# Patient Record
Sex: Male | Born: 1935 | Race: White | Hispanic: No | Marital: Married | Smoking: Never smoker
Health system: Southern US, Community
[De-identification: ages and names within clinical notes are randomized; demographics above are authoritative.]

## PROBLEM LIST (undated history)

## (undated) DIAGNOSIS — E785 Hyperlipidemia, unspecified: Secondary | ICD-10-CM

## (undated) DIAGNOSIS — I509 Heart failure, unspecified: Secondary | ICD-10-CM

## (undated) DIAGNOSIS — F0390 Unspecified dementia without behavioral disturbance: Secondary | ICD-10-CM

## (undated) DIAGNOSIS — I2510 Atherosclerotic heart disease of native coronary artery without angina pectoris: Secondary | ICD-10-CM

## (undated) DIAGNOSIS — I10 Essential (primary) hypertension: Secondary | ICD-10-CM

## (undated) DIAGNOSIS — F03C0 Unspecified dementia, severe, without behavioral disturbance, psychotic disturbance, mood disturbance, and anxiety: Secondary | ICD-10-CM

## (undated) HISTORY — PX: APPENDECTOMY: SHX54

## (undated) HISTORY — PX: CORONARY ARTERY BYPASS GRAFT: SHX141

## (undated) HISTORY — PX: TONSILLECTOMY: SUR1361

---

## 1997-12-25 ENCOUNTER — Inpatient Hospital Stay (HOSPITAL_COMMUNITY): Admission: AD | Admit: 1997-12-25 | Discharge: 1997-12-31 | Payer: Self-pay | Admitting: Cardiology

## 1998-01-15 ENCOUNTER — Encounter (HOSPITAL_COMMUNITY): Admission: RE | Admit: 1998-01-15 | Discharge: 1998-04-15 | Payer: Self-pay | Admitting: Cardiovascular Disease

## 1999-04-16 ENCOUNTER — Ambulatory Visit (HOSPITAL_COMMUNITY): Admission: RE | Admit: 1999-04-16 | Discharge: 1999-04-16 | Payer: Self-pay | Admitting: Specialist

## 1999-05-07 ENCOUNTER — Ambulatory Visit (HOSPITAL_COMMUNITY): Admission: RE | Admit: 1999-05-07 | Discharge: 1999-05-07 | Payer: Self-pay | Admitting: Specialist

## 2005-04-10 ENCOUNTER — Encounter: Admission: RE | Admit: 2005-04-10 | Discharge: 2005-04-10 | Payer: Self-pay | Admitting: Internal Medicine

## 2010-10-05 ENCOUNTER — Observation Stay (HOSPITAL_COMMUNITY)
Admission: EM | Admit: 2010-10-05 | Discharge: 2010-10-06 | Disposition: A | Discharge status: Home / Self Care | Payer: No Typology Code available for payment source | Attending: General Surgery | Admitting: General Surgery

## 2010-10-05 ENCOUNTER — Emergency Department (HOSPITAL_COMMUNITY): Payer: No Typology Code available for payment source

## 2010-10-05 DIAGNOSIS — Z79899 Other long term (current) drug therapy: Secondary | ICD-10-CM | POA: Insufficient documentation

## 2010-10-05 DIAGNOSIS — IMO0002 Reserved for concepts with insufficient information to code with codable children: Secondary | ICD-10-CM | POA: Insufficient documentation

## 2010-10-05 DIAGNOSIS — M47812 Spondylosis without myelopathy or radiculopathy, cervical region: Secondary | ICD-10-CM | POA: Insufficient documentation

## 2010-10-05 DIAGNOSIS — Y92009 Unspecified place in unspecified non-institutional (private) residence as the place of occurrence of the external cause: Secondary | ICD-10-CM | POA: Insufficient documentation

## 2010-10-05 DIAGNOSIS — S20219A Contusion of unspecified front wall of thorax, initial encounter: Principal | ICD-10-CM | POA: Insufficient documentation

## 2010-10-05 DIAGNOSIS — R9389 Abnormal findings on diagnostic imaging of other specified body structures: Secondary | ICD-10-CM | POA: Insufficient documentation

## 2010-10-05 DIAGNOSIS — I10 Essential (primary) hypertension: Secondary | ICD-10-CM | POA: Insufficient documentation

## 2010-10-05 DIAGNOSIS — S1093XA Contusion of unspecified part of neck, initial encounter: Secondary | ICD-10-CM | POA: Insufficient documentation

## 2010-10-05 DIAGNOSIS — I2510 Atherosclerotic heart disease of native coronary artery without angina pectoris: Secondary | ICD-10-CM | POA: Insufficient documentation

## 2010-10-05 DIAGNOSIS — S0003XA Contusion of scalp, initial encounter: Secondary | ICD-10-CM | POA: Insufficient documentation

## 2010-10-05 DIAGNOSIS — R071 Chest pain on breathing: Secondary | ICD-10-CM | POA: Insufficient documentation

## 2010-10-05 DIAGNOSIS — E785 Hyperlipidemia, unspecified: Secondary | ICD-10-CM | POA: Insufficient documentation

## 2010-10-05 DIAGNOSIS — S6000XA Contusion of unspecified finger without damage to nail, initial encounter: Secondary | ICD-10-CM | POA: Insufficient documentation

## 2010-10-05 DIAGNOSIS — Y998 Other external cause status: Secondary | ICD-10-CM | POA: Insufficient documentation

## 2010-10-05 LAB — BASIC METABOLIC PANEL
BUN: 12 mg/dL (ref 6–23)
CO2: 19 mEq/L (ref 19–32)
Chloride: 99 mEq/L (ref 96–112)
GFR calc Af Amer: >60 mL/min (ref >60)
GFR calc non Af Amer: >60 mL/min (ref >60)
Glucose, Bld: 109 mg/dL — ABNORMAL HIGH (ref 70–99)
Potassium: 4.2 mEq/L (ref 3.5–5.1)
Sodium: 133 mEq/L — ABNORMAL LOW (ref 135–145)

## 2010-10-05 LAB — POCT CARDIAC MARKERS
CKMB, poc: 4.4 ng/mL (ref 1.0–8.0)
Myoglobin, poc: 367 ng/mL (ref 12–200)
Troponin i, poc: <0.05 ng/mL (ref 0.00–0.09)

## 2010-10-05 LAB — PROTIME-INR
INR: 0.99 (ref 0.00–1.49)
Prothrombin Time: 13.3 seconds (ref 11.6–15.2)

## 2010-10-05 LAB — POCT I-STAT, CHEM 8
BUN: 11 mg/dL (ref 6–23)
Chloride: 102 mEq/L (ref 96–112)
Creatinine, Ser: 0.9 mg/dL (ref 0.4–1.5)
Glucose, Bld: 103 mg/dL — ABNORMAL HIGH (ref 70–99)
Hemoglobin: 16 g/dL (ref 13.0–17.0)
Potassium: 4 mEq/L (ref 3.5–5.1)
Sodium: 138 mEq/L (ref 135–145)
TCO2: 26 mmol/L (ref 0–100)

## 2010-10-05 LAB — CBC
HCT: 42.6 % (ref 39.0–52.0)
Hemoglobin: 15.3 g/dL (ref 13.0–17.0)
MCH: 32.4 pg (ref 26.0–34.0)
MCHC: 35.9 g/dL (ref 30.0–36.0)
RBC: 4.72 MIL/uL (ref 4.22–5.81)
RDW: 14.2 % (ref 11.5–15.5)
WBC: 13.2 10*3/uL — ABNORMAL HIGH (ref 4.0–10.5)

## 2010-10-05 LAB — DIFFERENTIAL
Basophils Absolute: 0 10*3/uL (ref 0.0–0.1)
Basophils Relative: 0 % (ref 0–1)
Eosinophils Relative: 0 % (ref 0–5)
Lymphs Abs: 0.6 10*3/uL — ABNORMAL LOW (ref 0.7–4.0)
Monocytes Absolute: 1.2 10*3/uL — ABNORMAL HIGH (ref 0.1–1.0)
Monocytes Relative: 9 % (ref 3–12)
Neutro Abs: 11.3 10*3/uL — ABNORMAL HIGH (ref 1.7–7.7)
Neutrophils Relative %: 86 % — ABNORMAL HIGH (ref 43–77)

## 2010-10-05 LAB — ABO/RH: ABO/RH(D): O POS

## 2010-10-05 LAB — TYPE AND SCREEN
ABO/RH(D): O POS
Antibody Screen: NEGATIVE

## 2010-10-05 MED ORDER — IOHEXOL 300 MG/ML  SOLN
100.0000 mL | Freq: Once | INTRAMUSCULAR | Status: AC | PRN
  Administered 2010-10-05: 100 mL via INTRAVENOUS

## 2010-10-06 LAB — BASIC METABOLIC PANEL
Calcium: 9 mg/dL (ref 8.4–10.5)
Chloride: 101 mEq/L (ref 96–112)
Creatinine, Ser: 0.7 mg/dL (ref 0.4–1.5)
GFR calc Af Amer: >60 mL/min (ref >60)
GFR calc non Af Amer: >60 mL/min (ref >60)
Glucose, Bld: 114 mg/dL — ABNORMAL HIGH (ref 70–99)
Potassium: 3.7 mEq/L (ref 3.5–5.1)
Sodium: 136 mEq/L (ref 135–145)

## 2010-10-06 LAB — CBC
HCT: 40.8 % (ref 39.0–52.0)
Hemoglobin: 14.3 g/dL (ref 13.0–17.0)
MCV: 91.1 fL (ref 78.0–100.0)
Platelets: 200 10*3/uL (ref 150–400)
RBC: 4.48 MIL/uL (ref 4.22–5.81)
RDW: 14.4 % (ref 11.5–15.5)
WBC: 9 10*3/uL (ref 4.0–10.5)
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
## 2013-04-15 ENCOUNTER — Emergency Department (HOSPITAL_COMMUNITY): Payer: Medicare Other

## 2013-04-15 ENCOUNTER — Encounter (HOSPITAL_COMMUNITY): Payer: Self-pay | Admitting: Emergency Medicine

## 2013-04-15 ENCOUNTER — Emergency Department (HOSPITAL_COMMUNITY)
Admission: EM | Admit: 2013-04-15 | Discharge: 2013-04-15 | Disposition: A | Discharge status: Home / Self Care | Payer: Medicare Other | Attending: Emergency Medicine | Admitting: Emergency Medicine

## 2013-04-15 DIAGNOSIS — W010XXA Fall on same level from slipping, tripping and stumbling without subsequent striking against object, initial encounter: Secondary | ICD-10-CM | POA: Insufficient documentation

## 2013-04-15 DIAGNOSIS — S022XXA Fracture of nasal bones, initial encounter for closed fracture: Secondary | ICD-10-CM

## 2013-04-15 DIAGNOSIS — S59909A Unspecified injury of unspecified elbow, initial encounter: Secondary | ICD-10-CM | POA: Insufficient documentation

## 2013-04-15 DIAGNOSIS — S6990XA Unspecified injury of unspecified wrist, hand and finger(s), initial encounter: Secondary | ICD-10-CM | POA: Insufficient documentation

## 2013-04-15 DIAGNOSIS — Y9289 Other specified places as the place of occurrence of the external cause: Secondary | ICD-10-CM | POA: Insufficient documentation

## 2013-04-15 DIAGNOSIS — Y9301 Activity, walking, marching and hiking: Secondary | ICD-10-CM | POA: Insufficient documentation

## 2013-04-15 DIAGNOSIS — S0990XA Unspecified injury of head, initial encounter: Secondary | ICD-10-CM | POA: Insufficient documentation

## 2013-04-15 DIAGNOSIS — I10 Essential (primary) hypertension: Secondary | ICD-10-CM | POA: Insufficient documentation

## 2013-04-15 DIAGNOSIS — Z9089 Acquired absence of other organs: Secondary | ICD-10-CM | POA: Insufficient documentation

## 2013-04-15 NOTE — ED Notes (Signed)
Bed: JW11 Expected date: 04/15/13 Expected time: 4:05 PM Means of arrival: Ambulance Comments: fall

## 2013-04-15 NOTE — ED Notes (Signed)
Per EMS patient was shopping and tripped. No LOC. Hit right side of face and skinned up hands trying to break his fall. Denies pain to neck and back.

## 2013-04-15 NOTE — ED Notes (Signed)
Goose egg to right forehead above eye. Deep skin tear to right base of thumb.Scratches to left hand. Skin tear across bridge of nose. All wounds cleansed with soap and water. Bacitracin applied where appropriate.

## 2013-04-15 NOTE — ED Provider Notes (Signed)
CSN: [id number]     Arrival date & time 04/15/13  1604 History   First MD Initiated Contact with Patient 04/15/13 1614     Chief Complaint  Patient presents with  . Fall   (Consider location/radiation/quality/duration/timing/severity/associated sxs/prior Treatment) HPI Patient presents after a mechanical fall with pain in his face, right wrist. Patient recalls the entirety of the event.  He states that he was walking across a parking lot, slipped fell to the ground.  No loss of consciousness, no subsequent confusion, disorientation, weakness in any extremity. Since the event there has been persistent.  No visual changes. Patient has minimal neck pain, but some pain with lateral rotation.  patient states that he generally well, was in his usual state of health prior to the onset of pain. History reviewed. No pertinent past medical history. Past Surgical History  Procedure Laterality Date  . Tonsillectomy    . Appendectomy     No family history on file. History  Substance Use Topics  . Smoking status: Never Smoker   . Smokeless tobacco: Not on file  . Alcohol Use: No     Comment: occassional    Review of Systems  All other systems reviewed and are negative.    Allergies  Review of patient's allergies indicates no known allergies.  Home Medications  No current outpatient prescriptions on file. BP 181/103  Pulse 77  Temp(Src) 97.8 F (36.6 C) (Oral)  Resp 20  SpO2 97% Physical Exam  Nursing note and vitals reviewed. Constitutional: He is oriented to person, place, and time. He appears well-developed. No distress.  HENT:  Head: Normocephalic and atraumatic.    Eyes: Conjunctivae and EOM are normal. Pupils are equal, round, and reactive to light.  Neck:  No tenderness to palpation about the midline, nor any deformity.  She describes mild tenderness in the midline with right lateral rotation.   Cardiovascular: Normal rate and regular rhythm.   Pulmonary/Chest:  Effort normal. No stridor. No respiratory distress.  Abdominal: He exhibits no distension. There is no tenderness.  Musculoskeletal: He exhibits no edema.       Arms: Neurological: He is alert and oriented to person, place, and time.  Skin: Skin is warm and dry.  Psychiatric: He has a normal mood and affect.    ED Course  Procedures (including critical care time) Labs Review Labs Reviewed - No data to display Imaging Review Dg Wrist Complete Right  04/15/2013   CLINICAL DATA:  Laceration of the base of the thumb.  EXAM: RIGHT WRIST - COMPLETE 3+ VIEW  COMPARISON:  None.  FINDINGS: There is severe osteopenia. There is no fracture or dislocation. There are severe degenerative changes of the triscaphe joint and 1st CMC joint. There are degenerative changes of the 1st, 2nd and 3rd MCP joints. There is no radiopaque foreign body.  IMPRESSION: No acute osseous injury of the right wrist. No radiopaque foreign body.   Electronically Signed   By: Elige Ko   On: 04/15/2013 17:02   Ct Head Wo Contrast  04/15/2013   CLINICAL DATA:  The patient tripped while shopping. Hit the right side of the face. No loss of consciousness.  EXAM: CT HEAD WITHOUT CONTRAST  CT MAXILLOFACIAL WITHOUT CONTRAST  CT CERVICAL SPINE WITHOUT CONTRAST  TECHNIQUE: Multidetector CT imaging of the head, cervical spine, and maxillofacial structures were performed using the standard protocol without intravenous contrast. Multiplanar CT image reconstructions of the cervical spine and maxillofacial structures were also generated.  COMPARISON:  10/05/2010  FINDINGS: CT HEAD FINDINGS  Large right frontal scalp hematoma is identified without underlying calvarial fracture.  There is moderate central and cortical atrophy. Periventricular white matter changes are consistent with small vessel disease. There is no evidence for hemorrhage, mass lesion, or acute infarction. Bilateral nasal bone fractures are identified. Orbits are intact.  CT  MAXILLOFACIAL FINDINGS  Large right frontal scalp hematoma is identified. There is no underlying calvarial fracture there are bilateral nasal bone fractures with minimal displacement. The bony nasal septum is intact. The orbits are intact. The globes are intact. Zygomatic arches, maxilla, mandible, temporomandibular joints, View pterygoid plates are intact. There is minimal mucoperiosteal thickening of the ethmoid sinuses.  CT CERVICAL SPINE FINDINGS  There is exaggerated cervical lordosis. Degenerative changes are seen throughout the cervical spine, most notably at C4-5, C5-6. Additionally, there is degenerative change at the T1-2 level, incidentally imaged. There is 2 mm of retrolisthesis of C5 on C6. There is 2 mm of anterolisthesis of C7 on T1. These changes are felt to be degenerative. There is no evidence for acute fracture or subluxation.  The lung apices are clear. Note is made of a left thyroid nodule, measuring 9 mm. As needed, further evaluation can be performed with ultrasound.  IMPRESSION: 1. Large right frontal scalp hematoma without underlying calvarial fracture. 2. Atrophy and small vessel disease without evidence for acute intracranial abnormality. 3. Bilateral nasal bone fractures. 4. Significant cervical degenerative change without evidence for acute cervical spine abnormality. 5. Incidentally noted left thyroid nodule. If needed, further evaluation with ultrasound can be performed.   Electronically Signed   By: Rosalie Gums M.D.   On: 04/15/2013 17:11   Ct Cervical Spine Wo Contrast  04/15/2013   CLINICAL DATA:  The patient tripped while shopping. Hit the right side of the face. No loss of consciousness.  EXAM: CT HEAD WITHOUT CONTRAST  CT MAXILLOFACIAL WITHOUT CONTRAST  CT CERVICAL SPINE WITHOUT CONTRAST  TECHNIQUE: Multidetector CT imaging of the head, cervical spine, and maxillofacial structures were performed using the standard protocol without intravenous contrast. Multiplanar CT image  reconstructions of the cervical spine and maxillofacial structures were also generated.  COMPARISON:  10/05/2010  FINDINGS: CT HEAD FINDINGS  Large right frontal scalp hematoma is identified without underlying calvarial fracture.  There is moderate central and cortical atrophy. Periventricular white matter changes are consistent with small vessel disease. There is no evidence for hemorrhage, mass lesion, or acute infarction. Bilateral nasal bone fractures are identified. Orbits are intact.  CT MAXILLOFACIAL FINDINGS  Large right frontal scalp hematoma is identified. There is no underlying calvarial fracture there are bilateral nasal bone fractures with minimal displacement. The bony nasal septum is intact. The orbits are intact. The globes are intact. Zygomatic arches, maxilla, mandible, temporomandibular joints, View pterygoid plates are intact. There is minimal mucoperiosteal thickening of the ethmoid sinuses.  CT CERVICAL SPINE FINDINGS  There is exaggerated cervical lordosis. Degenerative changes are seen throughout the cervical spine, most notably at C4-5, C5-6. Additionally, there is degenerative change at the T1-2 level, incidentally imaged. There is 2 mm of retrolisthesis of C5 on C6. There is 2 mm of anterolisthesis of C7 on T1. These changes are felt to be degenerative. There is no evidence for acute fracture or subluxation.  The lung apices are clear. Note is made of a left thyroid nodule, measuring 9 mm. As needed, further evaluation can be performed with ultrasound.  IMPRESSION: 1. Large right frontal scalp hematoma without underlying calvarial fracture.  2. Atrophy and small vessel disease without evidence for acute intracranial abnormality. 3. Bilateral nasal bone fractures. 4. Significant cervical degenerative change without evidence for acute cervical spine abnormality. 5. Incidentally noted left thyroid nodule. If needed, further evaluation with ultrasound can be performed.   Electronically Signed    By: Rosalie Gums M.D.   On: 04/15/2013 17:11   Ct Maxillofacial Wo Cm  04/15/2013   CLINICAL DATA:  The patient tripped while shopping. Hit the right side of the face. No loss of consciousness.  EXAM: CT HEAD WITHOUT CONTRAST  CT MAXILLOFACIAL WITHOUT CONTRAST  CT CERVICAL SPINE WITHOUT CONTRAST  TECHNIQUE: Multidetector CT imaging of the head, cervical spine, and maxillofacial structures were performed using the standard protocol without intravenous contrast. Multiplanar CT image reconstructions of the cervical spine and maxillofacial structures were also generated.  COMPARISON:  10/05/2010  FINDINGS: CT HEAD FINDINGS  Large right frontal scalp hematoma is identified without underlying calvarial fracture.  There is moderate central and cortical atrophy. Periventricular white matter changes are consistent with small vessel disease. There is no evidence for hemorrhage, mass lesion, or acute infarction. Bilateral nasal bone fractures are identified. Orbits are intact.  CT MAXILLOFACIAL FINDINGS  Large right frontal scalp hematoma is identified. There is no underlying calvarial fracture there are bilateral nasal bone fractures with minimal displacement. The bony nasal septum is intact. The orbits are intact. The globes are intact. Zygomatic arches, maxilla, mandible, temporomandibular joints, View pterygoid plates are intact. There is minimal mucoperiosteal thickening of the ethmoid sinuses.  CT CERVICAL SPINE FINDINGS  There is exaggerated cervical lordosis. Degenerative changes are seen throughout the cervical spine, most notably at C4-5, C5-6. Additionally, there is degenerative change at the T1-2 level, incidentally imaged. There is 2 mm of retrolisthesis of C5 on C6. There is 2 mm of anterolisthesis of C7 on T1. These changes are felt to be degenerative. There is no evidence for acute fracture or subluxation.  The lung apices are clear. Note is made of a left thyroid nodule, measuring 9 mm. As needed, further  evaluation can be performed with ultrasound.  IMPRESSION: 1. Large right frontal scalp hematoma without underlying calvarial fracture. 2. Atrophy and small vessel disease without evidence for acute intracranial abnormality. 3. Bilateral nasal bone fractures. 4. Significant cervical degenerative change without evidence for acute cervical spine abnormality. 5. Incidentally noted left thyroid nodule. If needed, further evaluation with ultrasound can be performed.   Electronically Signed   By: Rosalie Gums M.D.   On: 04/15/2013 17:11    EKG Interpretation   None      LACERATION REPAIR Performed by: Gerhard Munch Authorized by: Gerhard Munch Consent: Verbal consent obtained. Risks and benefits: risks, benefits and alternatives were discussed Consent given by: patient Patient identity confirmed: provided demographic data Prepped and Draped in normal sterile fashion Wound explored  Laceration Location: nose  Laceration Length: 4cm  No Foreign Bodies seen or palpated  Irrigation method: syringe Amount of cleaning: standard  Skin closure: sterile glue  Number of sutures: one container  Technique: rough edges closely approximated.  Patient tolerance: Patient tolerated the procedure well with no immediate complications.  MDM   1. Nasal fracture, closed, initial encounter    This patient presents after a fall with pain in his face, neck, head, wrist.  On exam he is awake alert, oriented x3.  Patient has mild hypertension, but no other notable vital sign abnormalities. Patient had a wound repair with glue.  The patient's wounds were  otherwise not amenable to suture repair.  He was discharged after a lengthy conversation with him and his wife on the need for return precautions, follow up instructions.    Gerhard Munch, MD 04/15/13 [phone]

## 2013-04-15 NOTE — ED Notes (Signed)
Wife called and stated that she would be here to pick him up.

## 2013-04-17 ENCOUNTER — Emergency Department (INDEPENDENT_AMBULATORY_CARE_PROVIDER_SITE_OTHER)
Admission: EM | Admit: 2013-04-17 | Discharge: 2013-04-17 | Disposition: A | Discharge status: Home / Self Care | Payer: Medicare Other | Source: Home / Self Care | Attending: Emergency Medicine | Admitting: Emergency Medicine

## 2013-04-17 ENCOUNTER — Encounter (HOSPITAL_COMMUNITY): Payer: Self-pay | Admitting: Emergency Medicine

## 2013-04-17 DIAGNOSIS — T148XXA Other injury of unspecified body region, initial encounter: Secondary | ICD-10-CM

## 2013-04-17 DIAGNOSIS — IMO0002 Reserved for concepts with insufficient information to code with codable children: Secondary | ICD-10-CM

## 2013-04-17 NOTE — Discharge Instructions (Signed)
Use antibiotic ointment on the skin tears and cover with a NON-STICK dressing, such as Telfa.  Change the dressing daily and reapply new ointment each time.

## 2013-04-17 NOTE — ED Provider Notes (Signed)
CSN: [id number]     Arrival date & time 04/17/13  1650 History   First MD Initiated Contact with Patient 04/17/13 1811     Chief Complaint  Patient presents with  . Wound Check   (Consider location/radiation/quality/duration/timing/severity/associated sxs/prior Treatment) HPI Comments: Wife here with pt. Instructions on ER discharge papers say to clean skin tear on R hand and replace dressing after 2 days, but wife didn't see dressing placed and is uncertain and anxious about changing it herself. Neither pt nor wife have any concerns about wound itself, only about how to care for it.   Patient is a 79 y.o. male presenting with wound check. The history is provided by the patient and the spouse.  Wound Check This is a new problem. Episode onset: 2 days ago. The problem occurs constantly. The problem has not changed since onset.Nothing aggravates the symptoms. Nothing relieves the symptoms. Treatments tried: was bandaged in the ER on 11/15. The treatment provided no relief.    History reviewed. No pertinent past medical history. Past Surgical History  Procedure Laterality Date  . Tonsillectomy    . Appendectomy     History reviewed. No pertinent family history. History  Substance Use Topics  . Smoking status: Never Smoker   . Smokeless tobacco: Not on file  . Alcohol Use: No     Comment: occassional    Review of Systems  Constitutional: Negative for fever and chills.  Skin: Positive for wound.    Allergies  Review of patient's allergies indicates no known allergies.  Home Medications  No current outpatient prescriptions on file. BP 168/97  Pulse 70  Temp(Src) 98.7 F (37.1 C) (Oral)  Resp 20  SpO2 98% Physical Exam  Constitutional: He appears well-developed and well-nourished. No distress.  Skin: Skin is warm and dry.  1.5cm diameter skin tear R hand in posterior of thenar area, smaller 0.5cm diameter abrasion/skin tear on posterior of proximal phalanx area of thumb.   Both areas are pink, moist, no drainage, no surrounding erythema. No evidence infection.     ED Course  Procedures (including critical care time) Labs Review Labs Reviewed - No data to display Imaging Review No results found.  EKG Interpretation    Date/Time:    Ventricular Rate:    PR Interval:    QRS Duration:   QT Interval:    QTC Calculation:   R Axis:     Text Interpretation:              MDM   1. Skin tear   demonstrated wound care for pt's wife, applied bacitracin, non-stick gauze and wrapped wounds.       Cathlyn Parsons, NP 04/17/13 [phone]

## 2013-04-17 NOTE — ED Notes (Signed)
Here for wound check and bandage change.  Denies pain and fever.  Pt voices no complaints.

## 2013-04-17 NOTE — ED Provider Notes (Signed)
Medical screening examination/treatment/procedure(s) were performed by non-physician practitioner and as supervising physician I was immediately available for consultation/collaboration.  Leslee Home, M.D.  Reuben Likes, MD 04/17/13 [phone]

## 2013-04-23 ENCOUNTER — Telehealth (HOSPITAL_COMMUNITY): Payer: Self-pay

## 2013-04-23 NOTE — ED Notes (Signed)
Patient's wife wants to know how long to keep skin tear covered.  Chart reviewed by Dr Lorenz Coaster he advised patient to keep it covered with ointment for 2 weeks;  Patient and wife made aware

## 2014-04-28 ENCOUNTER — Ambulatory Visit (INDEPENDENT_AMBULATORY_CARE_PROVIDER_SITE_OTHER): Payer: Medicare Other | Admitting: Family Medicine

## 2014-04-28 ENCOUNTER — Emergency Department (HOSPITAL_COMMUNITY)
Admission: EM | Admit: 2014-04-28 | Discharge: 2014-04-29 | Disposition: A | Discharge status: Home / Self Care | Payer: Medicare Other | Attending: Emergency Medicine | Admitting: Emergency Medicine

## 2014-04-28 ENCOUNTER — Emergency Department (HOSPITAL_COMMUNITY): Payer: Medicare Other

## 2014-04-28 ENCOUNTER — Encounter (HOSPITAL_COMMUNITY): Payer: Self-pay | Admitting: *Deleted

## 2014-04-28 VITALS — BP 144/89 | HR 61 | Temp 98.0°F | Resp 24

## 2014-04-28 DIAGNOSIS — W19XXXA Unspecified fall, initial encounter: Secondary | ICD-10-CM

## 2014-04-28 DIAGNOSIS — R296 Repeated falls: Secondary | ICD-10-CM | POA: Insufficient documentation

## 2014-04-28 DIAGNOSIS — Z9181 History of falling: Secondary | ICD-10-CM

## 2014-04-28 DIAGNOSIS — R2689 Other abnormalities of gait and mobility: Secondary | ICD-10-CM

## 2014-04-28 DIAGNOSIS — Y92009 Unspecified place in unspecified non-institutional (private) residence as the place of occurrence of the external cause: Secondary | ICD-10-CM

## 2014-04-28 DIAGNOSIS — R5381 Other malaise: Secondary | ICD-10-CM

## 2014-04-28 LAB — COMPREHENSIVE METABOLIC PANEL
ALT: 6 U/L (ref 0–53)
ANION GAP: 13 (ref 5–15)
AST: 15 U/L (ref 0–37)
Albumin: 3.4 g/dL — ABNORMAL LOW (ref 3.5–5.2)
Alkaline Phosphatase: 87 U/L (ref 39–117)
BILIRUBIN TOTAL: 0.7 mg/dL (ref 0.3–1.2)
BUN: 10 mg/dL (ref 6–23)
CHLORIDE: 102 meq/L (ref 96–112)
CO2: 27 mEq/L (ref 19–32)
CREATININE: 0.72 mg/dL (ref 0.50–1.35)
Calcium: 9.1 mg/dL (ref 8.4–10.5)
GFR, EST NON AFRICAN AMERICAN: 78 mL/min — AB (ref >90)
GLUCOSE: 89 mg/dL (ref 70–99)
Potassium: 4.1 mEq/L (ref 3.7–5.3)
Sodium: 142 mEq/L (ref 137–147)
Total Protein: 7.2 g/dL (ref 6.0–8.3)

## 2014-04-28 LAB — I-STAT CHEM 8, ED
BUN: 8 mg/dL (ref 6–23)
CALCIUM ION: 1.16 mmol/L (ref 1.13–1.30)
Chloride: 98 mEq/L (ref 96–112)
Creatinine, Ser: 0.7 mg/dL (ref 0.50–1.35)
Glucose, Bld: 87 mg/dL (ref 70–99)
HEMATOCRIT: 46 % (ref 39.0–52.0)
Hemoglobin: 15.6 g/dL (ref 13.0–17.0)
Potassium: 3.9 mEq/L (ref 3.7–5.3)
Sodium: 138 mEq/L (ref 137–147)
TCO2: 26 mmol/L (ref 0–100)

## 2014-04-28 LAB — URINALYSIS, ROUTINE W REFLEX MICROSCOPIC
Bilirubin Urine: NEGATIVE
GLUCOSE, UA: NEGATIVE mg/dL
Ketones, ur: NEGATIVE mg/dL
Leukocytes, UA: NEGATIVE
Nitrite: NEGATIVE
PH: 7 (ref 5.0–8.0)
Protein, ur: NEGATIVE mg/dL
SPECIFIC GRAVITY, URINE: 1.008 (ref 1.005–1.030)
Urobilinogen, UA: 1 mg/dL (ref 0.0–1.0)

## 2014-04-28 LAB — DIFFERENTIAL
BASOS PCT: 0 % (ref 0–1)
Basophils Absolute: 0 10*3/uL (ref 0.0–0.1)
Eosinophils Absolute: 0.1 10*3/uL (ref 0.0–0.7)
Eosinophils Relative: 1 % (ref 0–5)
Lymphocytes Relative: 15 % (ref 12–46)
Lymphs Abs: 0.7 10*3/uL (ref 0.7–4.0)
MONOS PCT: 14 % — AB (ref 3–12)
Monocytes Absolute: 0.7 10*3/uL (ref 0.1–1.0)
Neutro Abs: 3.4 10*3/uL (ref 1.7–7.7)
Neutrophils Relative %: 70 % (ref 43–77)

## 2014-04-28 LAB — ETHANOL

## 2014-04-28 LAB — CBC
HCT: 40.7 % (ref 39.0–52.0)
HEMOGLOBIN: 13.8 g/dL (ref 13.0–17.0)
MCH: 30.8 pg (ref 26.0–34.0)
MCHC: 33.9 g/dL (ref 30.0–36.0)
MCV: 90.8 fL (ref 78.0–100.0)
Platelets: 242 10*3/uL (ref 150–400)
RBC: 4.48 MIL/uL (ref 4.22–5.81)
RDW: 14.2 % (ref 11.5–15.5)
WBC: 4.9 10*3/uL (ref 4.0–10.5)

## 2014-04-28 LAB — RAPID URINE DRUG SCREEN, HOSP PERFORMED
AMPHETAMINES: NOT DETECTED
Barbiturates: NOT DETECTED
Benzodiazepines: NOT DETECTED
Cocaine: NOT DETECTED
OPIATES: NOT DETECTED
Tetrahydrocannabinol: NOT DETECTED

## 2014-04-28 LAB — I-STAT TROPONIN, ED: TROPONIN I, POC: 0 ng/mL (ref 0.00–0.08)

## 2014-04-28 LAB — PROTIME-INR
INR: 1.11 (ref 0.00–1.49)
Prothrombin Time: 14.4 seconds (ref 11.6–15.2)

## 2014-04-28 LAB — APTT: APTT: 31 s (ref 24–37)

## 2014-04-28 LAB — URINE MICROSCOPIC-ADD ON

## 2014-04-28 NOTE — Discharge Instructions (Signed)

## 2014-04-28 NOTE — Progress Notes (Signed)
   Subjective:   This chart was scribed for Norberto SorensonEva Malloree Raboin MD by Arlan OrganAshley Leger, [facility] Scribe. This patient was seen in room 9 and the patient's care was started 4:45 PM.    Patient ID: Jason York, male    DOB: 05/08/1919, 80 y.o.   MRN: [id number]  Chief Complaint  Patient presents with  . Genia HotterFall    Fell in bathroom sometime last night-Feels like health has deteriorate over the last couple of days  . Foot Swelling  . Gait Problem     HPI  HPI Comments: Jason York here with his wife and daughter is a 80 y.o. male who [facility] complaining of an unwitnessed fall that occurred last night. Daughter states he was found early this morning but was unaware of the mechanism of the fall. Wife and daughter state pt has been "deteriating" in last few days. This includes more effort needed to get into bed along with ongoing worsening fatigue throughout the day and near syncope when standing up. Wife also mentions recent unexplained weight loss in last few months. She reports hiccups and belching after eating meals. Pt has been off of all medications for approximately 2 years now after deciding to stop all care due to "nothing working". No CP, leg swelling, or SOB. No tremors to hands. He denies any recent bowel issues or urinary difficulties.   No past medical history on file.   No current outpatient prescriptions on file prior to visit.   No current facility-administered medications on file prior to visit.    No Known Allergies   Review of Systems  Constitutional: Positive for activity change and fatigue. Negative for fever, chills and appetite change.  Respiratory: Positive for cough. Negative for shortness of breath.   Cardiovascular: Negative for chest pain and leg swelling.  Neurological: Positive for syncope (Near Syncope). Negative for dizziness, weakness and numbness.    Triage Vitals: BP 144/89 mmHg  Pulse 61  Temp(Src) 98 F  (36.7 C) (Oral)  Resp 24  Ht   Wt   SpO2 99%   Objective:  Physical Exam  Constitutional: He is oriented to person, place, and time. He appears well-developed and well-nourished.  HENT:  Head: Normocephalic.  Eyes: EOM are normal.  Neck: Normal range of motion.  Cardiovascular: Normal rate, regular rhythm and normal heart sounds.  Exam reveals no gallop and no friction rub.   No murmur heard. Pulmonary/Chest: Effort normal and breath sounds normal.  Abdominal: He exhibits no distension.  Musculoskeletal: Normal range of motion.  Contractors noted to R hand  Neurological: He is alert and oriented to person, place, and time.  Psychiatric: He has a normal mood and affect.  Nursing note and vitals reviewed.    Assessment & Plan:   Will send pt to Emergency Department for full workup including CT scan of head after unwitnessed fall. Debility  Risk for falls  Fall at home, initial encounter  Imbalance I will be happy to sign off on home health orders inc PT/OT for pt.  I personally performed the services described in this documentation, which was scribed in my presence. The recorded information has been reviewed and is accurate.

## 2014-04-28 NOTE — ED Provider Notes (Signed)
Medical screening examination/treatment/procedure(s) were conducted as a shared visit with non-physician practitioner(s) and myself.  I personally evaluated the patient during the encounter.   EKG Interpretation   Date/Time:  Saturday April 28 2014 18:15:27 EST Ventricular Rate:  61 PR Interval:  168 QRS Duration: 122 QT Interval:  449 QTC Calculation: 452 R Axis:   85 Text Interpretation:  Sinus rhythm IVCD, consider atypical RBBB No  significant change since last tracing Confirmed by Khalif Stender,  DO, Sharra Cayabyab  [phone]([id number]) on 04/28/2014 6:22:17 PM      Pt is a 80 y.o. M with no significant past medical history who presents emergency department with acute onset difficulty ambulating that started 2 days ago. Family reports he normally ambulatory with a cane but has had unsteady gait for the past several days. Patient is neurologically intact. Able to ambulate in the ED without any assistance and has a normal, steady gait. No staggering gait. Doubt infarct. Head CT unremarkable for acute changes. Labs unremarkable. Anticipate discharge home with close outpatient follow-up. We'll consult case management and social work has one reports she feels she needs help managing him at home.  Layla MawKristen N Tanara Turvey, DO 04/28/14 2255

## 2014-04-28 NOTE — Patient Instructions (Signed)
Community Programs Location  Contact Information Description of Services Cost  A Matter of Balance Class locations vary. Call [facility] Area Agency on Aging for more information.  www.ptrc.org [phone] 8-Session program addressing the fear of falling and increasing activity levels of older adults Free to minimal cost  [facility] [address], [place], [place] [address].  [email or web address] [phone]  Personal training, gym, classes including Silver Sneakers* and ACTion for Aging Adults Fee-based  A.H.O.Y. (Add Health to Our Years) Airs on [employer] Channel 13, M-F at 8am and 1pm  [place] Locations: [facility],  [address] [facility], [address] [facility] [address] [facility],  [address] [facility], [address] [facility], [address] [facility], [address] [facility], [address] [facility], [address] [facility] [address]  [place] Location: [facility] [address]      [phone]  [phone]  [phone]  [phone]  [phone]  [phone]  [phone]  [phone]  [phone]  [phone]    [phone] A total-body conditioning class for adults 55 and older; designed to increase muscular strength, endurance, range of movement, flexibility, balance, agility and coordination Free  [facility] [address] [place], [place] [address] [phone] Silver Sneakers* Tai Chi Fee-based       [facility]      [address]      [phone]      Pilate's class for individualsreturning to exercise after an injury, before or after surgery or for individuals with complex musculoskeletal issues; designed to improve strength, balance , flexibility      $15/class  [place]  T[facility] [address] [place], [place] [address] [email or web address] [phone] Tai Chi classes for beginners to advanced Donation-based  [facility] [address] [place], [place] [address] [email or web address] [phone]  Tai Chi Chair Exercises Free, ages 60 and older; Ages 55-59 fee based  [facility] [address] [place], [place] [address] [email or web address] [phone]  A.H.O.Y. Tai Chi Fee-based Donation based or free  [facility] Tai Chi Class locations vary.  Call or email Eric Reiss or view website for more information. [email or web address] [phone] Ongoing classes at local YMCAs and gyms Fee-based  Silver Sneakers [facility] [employer]: Area [facility] [address] [phone]  [phone] Classes designed for older adults who want to improve their strength, flexibility, balance and endurance.   Silver sneakers is covered by some insurance plans and includes a fitness center membership at participating locations. Find out more by calling [phone] or visiting [email or web address] Covered by some insurance plans  [facility] [address]  [place] [phone] A.H.O.Y., fitness room, personal training, fitness classes for injury prevention, strength, balance, flexibility, water fitness classes Ages 55+: $60 for 6 months; Ages 18-54: $70 for 6 months  Tai [facility] for Everybody [facility] [address] [place], [place] [address] [email or web address] [phone] Tai Chi classes for beginners to advanced; geared for seniors Donation Based        [employer] [facility] [employer] Participate in Exercise     Paul G. Davis, PhD, RCEP [address] [email or web address] [employer] [facility]     [phone]     A comprehensive fitness program for adults.  The program paris senior-level undergraduates Kinesiology students with adults who desire to learn how to exercise safely.  Includes a structural exercise class focusing on functional fitnesss     $100/semester in fall and spring; $75 in summer (no trainers)    *Silver Sneakers is covered by some insurance plans and includes a  Fitness center membership at participating locations.  Find out more by calling [phone] or visiting [email or web address]  For additional health and human services resources for senior adults, please contact [facility] at [phone] in [place] and [place] at [phone] in all other areas.Fall Prevention and Home Safety Falls cause injuries and can affect all age groups. It is possible to use preventive measures to significantly decrease the likelihood of falls. There are many simple measures which can make your home safer and prevent falls. OUTDOORS  Repair cracks and edges of walkways and driveways.  Remove high doorway thresholds.  Trim shrubbery on the main path into your home.  Have good outside lighting.  Clear walkways of tools, rocks, debris, and clutter.  Check that handrails are not broken and are securely fastened. Both sides of steps should have handrails.  Have leaves, snow, and ice cleared regularly.  Use sand or salt on walkways during winter months.  In the garage, clean up grease or oil spills. BATHROOM  Install night lights.  Install grab bars by the toilet and in the tub and shower.  Use non-skid mats or decals in the tub or shower.  Place a plastic  non-slip stool in the shower to sit on, if needed.  Keep floors dry and clean up all water on the floor immediately.  Remove soap buildup in the tub or shower on a regular basis.  Secure bath mats with non-slip, double-sided rug tape.  Remove throw rugs and tripping hazards from the floors. BEDROOMS  Install night lights.  Make sure a bedside light is easy to reach.  Do not use oversized bedding.  Keep a telephone by your bedside.  Have a firm chair with side arms to use for getting dressed.  Remove throw rugs and tripping hazards from the floor. KITCHEN  Keep handles on pots and pans turned toward the center of the stove. Use back burners when possible.  Clean up spills quickly and allow time for drying.  Avoid walking on wet floors.  Avoid hot utensils and knives.  Position shelves so they are not too high or low.  Place commonly used objects within easy reach.  If necessary, use a sturdy step stool with a grab bar when reaching.  Keep electrical cables out of the way.  Do not use floor polish or wax that makes floors slippery. If you must use wax, use non-skid floor wax.  Remove throw rugs and tripping hazards from the floor. STAIRWAYS  Never leave objects on stairs.  Place handrails on both sides of stairways and use them. Fix any loose handrails. Make sure handrails on both sides of the stairways are as long as the stairs.  Check carpeting to make sure it is firmly attached along stairs. Make repairs to worn or loose carpet promptly.  Avoid placing throw rugs at the top or bottom of stairways, or properly secure the rug with carpet tape to prevent slippage. Get rid of throw rugs, if   possible.  Have an electrician put in a light switch at the top and bottom of the stairs. OTHER FALL PREVENTION TIPS  Wear low-heel or rubber-soled shoes that are supportive and fit well. Wear closed toe shoes.  When using a stepladder, make sure it is fully opened and both  spreaders are firmly locked. Do not climb a closed stepladder.  Add color or contrast paint or tape to grab bars and handrails in your home. Place contrasting color strips on first and last steps.  Learn and use mobility aids as needed. Install an electrical emergency response system.  Turn on lights to avoid dark areas. Replace light bulbs that burn out immediately. Get light switches that glow.  Arrange furniture to create clear pathways. Keep furniture in the same place.  Firmly attach carpet with non-skid or double-sided tape.  Eliminate uneven floor surfaces.  Select a carpet pattern that does not visually hide the edge of steps.  Be aware of all pets. OTHER HOME SAFETY TIPS  Set the water temperature for 120 F (48.8 C).  Keep emergency numbers on or near the telephone.  Keep smoke detectors on every level of the home and near sleeping areas. Document Released: 05/08/2002 Document Revised: 11/17/2011 Document Reviewed: 08/07/2011 ExitCare Patient Information 2015 [employer]. This information is not intended to replace advice given to you by your health care provider. Make sure you discuss any questions you have with your health care provider.  

## 2014-04-28 NOTE — ED Provider Notes (Signed)
CSN: [id number]     Arrival date & time 04/28/14  1734 History   First MD Initiated Contact with Patient 04/28/14 1910     Chief Complaint  Patient presents with  . Fall     (Consider location/radiation/quality/duration/timing/severity/associated sxs/prior Treatment) HPI Comments: Patient with no pertinent past medical history, presents emergency department with chief complaints of loss of balance. Patient is accompanied by his daughter, who is in town for the holiday. She states that she noticed after Thanksgiving, the patient was not walking as easily as he used to. She states that he frequently loses his balance and falls. She took him to an urgent care earlier today, and they referred him to the emergency department. Patient denies any headache, chest pain, shortness of breath, abdominal pain, or pain of any kind. He does not have a primary care physician.  The history is provided by the patient. No language interpreter was used.    History reviewed. No pertinent past medical history. Past Surgical History  Procedure Laterality Date  . Tonsillectomy    . Appendectomy     Family History  Problem Relation Age of Onset  . Stroke Sister    History  Substance Use Topics  . Smoking status: Never Smoker   . Smokeless tobacco: Never Used  . Alcohol Use: 1.2 - 1.8 oz/week    2-3 Not specified per week     Comment: occassional    Review of Systems  Constitutional: Negative for fever and chills.  Respiratory: Negative for shortness of breath.   Cardiovascular: Negative for chest pain.  Gastrointestinal: Negative for nausea, vomiting, diarrhea and constipation.  Genitourinary: Negative for dysuria.  Neurological:       Balance problems      Allergies  Review of patient's allergies indicates no known allergies.  Home Medications   Prior to Admission medications   Not on File   BP 131/104 mmHg  Pulse 65  Temp(Src) 98.6 F (37 C) (Oral)  Resp 20  SpO2 100% Physical  Exam  Constitutional: He is oriented to person, place, and time. He appears well-developed and well-nourished.  HENT:  Head: Normocephalic and atraumatic.  Eyes: Conjunctivae and EOM are normal. Pupils are equal, round, and reactive to light. Right eye exhibits no discharge. Left eye exhibits no discharge. No scleral icterus.  Neck: Normal range of motion. Neck supple. No JVD present.  Cardiovascular: Normal rate, regular rhythm and normal heart sounds.  Exam reveals no gallop and no friction rub.   No murmur heard. Pulmonary/Chest: Effort normal and breath sounds normal. No respiratory distress. He has no wheezes. He has no rales. He exhibits no tenderness.  Abdominal: Soft. He exhibits no distension and no mass. There is no tenderness. There is no rebound and no guarding.  Musculoskeletal: Normal range of motion. He exhibits no edema or tenderness.  Neurological: He is alert and oriented to person, place, and time.  CN III-12 intact, speech is clear, movements are goal oriented  Skin: Skin is warm and dry.  Psychiatric: He has a normal mood and affect. His behavior is normal. Judgment and thought content normal.  Nursing note and vitals reviewed.   ED Course  Procedures (including critical care time) Results for orders placed or performed during the hospital encounter of 04/28/14  Ethanol  Result Value Ref Range   Alcohol, Ethyl (B) <11 0 - 11 mg/dL  Protime-INR  Result Value Ref Range   Prothrombin Time 14.4 11.6 - 15.2 seconds   INR 1.11  0.00 - 1.49  APTT  Result Value Ref Range   aPTT 31 24 - 37 seconds  CBC  Result Value Ref Range   WBC 4.9 4.0 - 10.5 K/uL   RBC 4.48 4.22 - 5.81 MIL/uL   Hemoglobin 13.8 13.0 - 17.0 g/dL   HCT 16.140.7 09.639.0 - 04.552.0 %   MCV 90.8 78.0 - 100.0 fL   MCH 30.8 26.0 - 34.0 pg   MCHC 33.9 30.0 - 36.0 g/dL   RDW 40.914.2 81.111.5 - 91.415.5 %   Platelets 242 150 - 400 K/uL  Differential  Result Value Ref Range   Neutrophils Relative % 70 43 - 77 %   Neutro  Abs 3.4 1.7 - 7.7 K/uL   Lymphocytes Relative 15 12 - 46 %   Lymphs Abs 0.7 0.7 - 4.0 K/uL   Monocytes Relative 14 (H) 3 - 12 %   Monocytes Absolute 0.7 0.1 - 1.0 K/uL   Eosinophils Relative 1 0 - 5 %   Eosinophils Absolute 0.1 0.0 - 0.7 K/uL   Basophils Relative 0 0 - 1 %   Basophils Absolute 0.0 0.0 - 0.1 K/uL  Comprehensive metabolic panel  Result Value Ref Range   Sodium 142 137 - 147 mEq/L   Potassium 4.1 3.7 - 5.3 mEq/L   Chloride 102 96 - 112 mEq/L   CO2 27 19 - 32 mEq/L   Glucose, Bld 89 70 - 99 mg/dL   BUN 10 6 - 23 mg/dL   Creatinine, Ser 7.820.72 0.50 - 1.35 mg/dL   Calcium 9.1 8.4 - 95.610.5 mg/dL   Total Protein 7.2 6.0 - 8.3 g/dL   Albumin 3.4 (L) 3.5 - 5.2 g/dL   AST 15 0 - 37 U/L   ALT 6 0 - 53 U/L   Alkaline Phosphatase 87 39 - 117 U/L   Total Bilirubin 0.7 0.3 - 1.2 mg/dL   GFR calc non Af Amer 78 (L) >90 mL/min   GFR calc Af Amer >90 >90 mL/min   Anion gap 13 5 - 15  I-Stat Chem 8, ED  Result Value Ref Range   Sodium 138 137 - 147 mEq/L   Potassium 3.9 3.7 - 5.3 mEq/L   Chloride 98 96 - 112 mEq/L   BUN 8 6 - 23 mg/dL   Creatinine, Ser 2.130.70 0.50 - 1.35 mg/dL   Glucose, Bld 87 70 - 99 mg/dL   Calcium, Ion 0.861.16 5.781.13 - 1.30 mmol/L   TCO2 26 0 - 100 mmol/L   Hemoglobin 15.6 13.0 - 17.0 g/dL   HCT 46.946.0 62.939.0 - 52.852.0 %  I-Stat Troponin, ED [facility])  Result Value Ref Range   Troponin i, poc 0.00 0.00 - 0.08 ng/mL   Comment 3           Ct Head Wo Contrast  04/28/2014   CLINICAL DATA:  Unwitnessed fall last night.  EXAM: CT HEAD WITHOUT CONTRAST  TECHNIQUE: Contiguous axial images were obtained from the base of the skull through the vertex without intravenous contrast.  COMPARISON:  04/15/2013.  FINDINGS: Diffusely enlarged ventricles and subarachnoid spaces. Patchy white matter low density in both cerebral hemispheres. Old left basal ganglia lacune infarct. No skull fracture, intracranial hemorrhage, mass lesion, CT evidence of acute infarction or paranasal sinus  air-fluid levels.  IMPRESSION: 1. No acute abnormality. 2. Stable atrophy and chronic small vessel white matter ischemic changes. 3. 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   On: 04/28/2014 21:26      MDM   Final diagnoses:  Balance problem    Patient with increasingly frequent falls.  Family concerned about a stroke vs syncope.  Will check labs and head CT.  9:38 PM Labs and CT are reassuring. Patient seen by and discussed with Dr. Elesa MassedWard.  Labs and imaging are reassuring.  Patient ambulates without difficulty in the ED.  He is stable and ready for discharge.  Will consult case management for assistance with home health.  Dr. Elesa MassedWard agrees with the plan.  11:43 PM UA remarkable for traumatic cath, but no infection.  DC as above.      Roxy Horsemanobert Brayden Betters, PA-C 04/28/14 2355

## 2014-04-28 NOTE — ED Notes (Signed)
Pt was sent here from urgent care due to frequent falls, unexplained weight loss and possible syncopal episodes, per wife.

## 2014-04-28 NOTE — ED Notes (Addendum)
Pt ambulated to the restroom without any difficulty or assistance.  Dr. Elesa MassedWard and Rob, [place] notified.

## 2014-04-29 NOTE — Progress Notes (Signed)
   CARE MANAGEMENT ED NOTE 04/29/2014  Patient:  Jason York,Jason York   Account Number:  [id number]  Date Initiated:  04/28/2014  Documentation initiated by:  [facility],Graydon Fofana  Subjective/Objective Assessment:     Subjective/Objective Assessment Detail:   Patient with no pertinent past medical history, presents emergency department with chief complaints of loss of balance. Patient is accompanied by his daughter, who is in town for the holiday. She states that she noticed after Thanksgiving, the patient was not walking as easily as he used to. She states that he frequently loses his balance and falls.     Action/Plan:   Arrange HH PT/ RN /SW   Action/Plan Detail:   Anticipated DC Date:  04/28/2014     Status Recommendation to Physician:   Result of Recommendation:  Agreed  Other ED Services  Appropriate Status Consult     [facility]  [facility]ALTH   Choice offered to / List presented to:  C-3 Spouse     HH arranged  HH-1 RN  HH-2 PT  HH-6 SOCIAL WORKER      HH agency  [facility]    Status of service:  Completed, signed off  ED Comments:   ED Comments Detail:  04/29/14  5:33p Beecher McardleW. Linnette Panella RN NCM [phone] ED CM consulted by Ivar Drapeob Browning PA-C in the  ED  regarding [facility]. Pt presented to ED with s/p fall and weakness. [facility]. [facility]. Contacted patient and wife at home,  verified information with patient and wife. As per wife patient lives at home at home with wife primary caregiver. Patient ambulates at home with walker, and has been having recurring falls. Discussed the recommendation for Home Health services, patient and wife they are  agreeable with plan. Discussed HH services RN/PT/SW, and the  gait training program . Offered choice, provided [facility], [facility].. Pt has DME rolling walker, HH Referral faxed in to [phone] received Fax  confirmation. Verified current address and phone number with patient. Updated Dr. Sharin MonsSantami and Marcelino DusterMichelle  RN on [place] agreeable to discharge plan. Informed patient and family that  [facility] will [facility] them at the number provided within 24 -48hr to set up a the initial visit. Verbalized understanding teach back done. Provided my contact information should any questions or concerns should arise concerning HH services. Pt appreicative and agrees with plan Pt verbalized understanding using teach back method.  CM will follow up with patient and wife in the  week.

## 2014-05-02 ENCOUNTER — Telehealth: Payer: Self-pay

## 2014-05-02 NOTE — Telephone Encounter (Addendum)
THIS MESSAGE IS FROM JACK - PHYSICAL THERAPIST AT [facility]: HE WOULD LIKE DR. Carley HammedEVA SHAW TO REFER MR. Rauch FOR OCCUPATIONAL THERAPY, SPEECH FOR COGNITIVE ACCESSMENT, AND HOME HEALTH AIDE.  BEST PHONE [phone] [facility](JACK-[facility])   MBC

## 2014-05-03 NOTE — Telephone Encounter (Signed)
Please advise if ok to send orders for these.

## 2014-05-04 ENCOUNTER — Other Ambulatory Visit: Payer: Self-pay | Admitting: *Deleted

## 2014-05-04 NOTE — Telephone Encounter (Signed)
Yes, that's fine 

## 2014-05-06 ENCOUNTER — Telehealth: Payer: Self-pay

## 2014-05-06 DIAGNOSIS — R2689 Other abnormalities of gait and mobility: Secondary | ICD-10-CM

## 2014-05-06 NOTE — Telephone Encounter (Signed)
Patients wife, Royce Macadamiaurelia, is calling on behalf of patient. She purchased a stair lift and wanted a prescription for the chair life so that they can be exempted from paying sales tax Aurelia number: [phone] (house)

## 2014-05-07 NOTE — Telephone Encounter (Signed)
Yes, that is fine. 

## 2014-05-07 NOTE — Telephone Encounter (Signed)
Spoke to HobergJack- VO given for home care/pt/ot and speech therapy.

## 2014-05-07 NOTE — Telephone Encounter (Signed)
Spoke to pt wife- Order placed asked Chelle to sign for Dr. Clelia CroftShaw.  [facility]  fax [phone]

## 2014-05-07 NOTE — Telephone Encounter (Signed)
Please advise if possible to get this prescription for them

## 2014-05-08 NOTE — Telephone Encounter (Signed)
Faxed order

## 2014-05-23 ENCOUNTER — Telehealth: Payer: Self-pay | Admitting: Physician Assistant

## 2014-05-23 DIAGNOSIS — R5381 Other malaise: Secondary | ICD-10-CM

## 2014-05-23 DIAGNOSIS — R2689 Other abnormalities of gait and mobility: Secondary | ICD-10-CM

## 2014-05-23 DIAGNOSIS — Z9181 History of falling: Secondary | ICD-10-CM

## 2014-05-23 NOTE — Telephone Encounter (Signed)
Will complete home PT. Had [facility] balance and fall prevention [facility], and the patient is interested in participating.  If you agree, please fax an order there: [phone]  They will then contact the patient to schedule.

## 2014-05-24 NOTE — Telephone Encounter (Signed)
Yes, please refer due to debility, imbalance, fall risk. Thanks, ES

## 2014-05-24 NOTE — Telephone Encounter (Signed)
Referral put in for PT as below.

## 2014-06-11 ENCOUNTER — Ambulatory Visit: Payer: Medicare Other | Attending: Family Medicine

## 2014-06-11 DIAGNOSIS — R262 Difficulty in walking, not elsewhere classified: Secondary | ICD-10-CM | POA: Insufficient documentation

## 2014-06-11 DIAGNOSIS — R279 Unspecified lack of coordination: Secondary | ICD-10-CM

## 2014-06-11 NOTE — Therapy (Signed)
[facility] [address] [place], [place], [address] Phone: [phone]   Fax:  [phone]  Physical Therapy Evaluation  Patient Details  Name: Jason York MRN: [id number] Date of Birth: 01-24-1919 Referring Provider:  Sherren Mocha, MD  Encounter Date: 06/11/2014      PT End of Session - 06/11/14 1630    Visit Number 1   Number of Visits 17   Date for PT Re-Evaluation 08/10/14   Authorization Type UHC Medicare-G code every 10th visit   PT Start Time 1535   PT Stop Time 1625   PT Time Calculation (min) 50 min      History reviewed. No pertinent past medical history.  Past Surgical History  Procedure Laterality Date  . Tonsillectomy    . Appendectomy      There were no vitals taken for this visit.  Visit Diagnosis:  Lack of coordination  Difficulty walking      Subjective Assessment - 06/11/14 1542    Symptoms Pt has had a significant decline in balance after thanksgiving 2015. With many frequent falls. Pt's wife is very reluctant for pt to particiate in physical therapy stating "He is 81 years old and I don't believe anything is going to help him at this point."   Pertinent History No significant past medical history according to wife.   Patient Stated Goals Pt doesn't have any goals for therapy and doesn't want to improve his balance.          [facility] PT Assessment - 06/11/14 0001    Assessment   Medical Diagnosis Frequent falls, imbalance   Onset Date --  November 2015   Prior Therapy --  Home health OT,PT,ST   Precautions   Precautions Fall   Balance Screen   Has the patient fallen in the past 6 months Yes   How many times? 4  falls have occurred up/down steps, walking in dark   Has the patient had a decrease in activity level because of a fear of falling?  No   Is the patient reluctant to leave their home because of a fear of falling?  No   Home Environment   Living Enviornment Private  residence   Living Arrangements Spouse/significant other   Available Help at Discharge Family   Type of Home House   Home Access Stairs to enter   Entrance Stairs-Number of Steps 4   Entrance Stairs-Rails Right   Home Layout Two level   Alternate Level Stairs-Number of Steps 13   Alternate Level Stairs-Rails --  pt uses stair/chair lift to ascend to second floor   Home Equipment Cane - quad;Cane - single point;Walker - 2 wheels;Shower seat - built in;Grab bars - toilet;Grab bars - tub/shower   Strength   Right Hip Flexion 3+/5   Left Hip Flexion 4/5   Right Knee Flexion 5/5   Right Knee Extension 4/5   Left Knee Flexion 3+/5   Left Knee Extension 3+/5   Right Ankle Dorsiflexion 5/5   Right Ankle Plantar Flexion 5/5   Left Ankle Dorsiflexion 5/5   Left Ankle Plantar Flexion 5/5   Standardized Balance Assessment   Standardized Balance Assessment Berg Balance Test;Timed Up and Go Test   Berg Balance Test   Sit to Stand Able to stand  independently using hands   Standing Unsupported Able to stand safely 2 minutes   Sitting with Back Unsupported but Feet Supported on Floor or Stool Able to sit safely and  securely 2 minutes   Stand to Sit Sits safely with minimal use of hands   Transfers Able to transfer safely, minor use of hands   Standing Unsupported with Eyes Closed Able to stand 10 seconds safely   Standing Ubsupported with Feet Together Able to place feet together independently and stand for 1 minute with supervision   From Standing, Reach Forward with Outstretched Arm Can reach forward >12 cm safely (5")   From Standing Position, Pick up Object from Floor Able to pick up shoe, needs supervision   From Standing Position, Turn to Look Behind Over each Shoulder Needs supervision when turning   Turn 360 Degrees Needs close supervision or verbal cueing   Standing Unsupported, Alternately Place Feet on Step/Stool Able to complete >2 steps/needs minimal assist   Standing Unsupported,  One Foot in Front Needs help to step but can hold 15 seconds   Standing on One Leg Unable to try or needs assist to prevent fall   Total Score 36       Self Care: Thorough discussion x23 minutes with patient and wife regarding the purpose and benefits of physical therapy, the berg balance test findings, pt's potential for benefit from physical therapy, and the plan for physical therapy if he decides to participate in it.                   PT Education - 06/11/14 1629    Education provided Yes   Education Details purpose and plan of physical therapy   Person(s) Educated Patient;Spouse   Methods Explanation   Comprehension Verbalized understanding          PT Short Term Goals - 06/11/14 1636    PT SHORT TERM GOAL #1   Title Demonstrate correct performance of HEP to address balance impairment. Target: 07/13/14   PT SHORT TERM GOAL #2   Title Improve score on Berg Balance Test to 40/56 for decreasing fall risk. Target 07/13/14   PT SHORT TERM GOAL #3   Title Complete TUG and write appropriate STG and LTG_________________   PT SHORT TERM GOAL #4   Title Complete 4462m walk and write appropriate STG and LTG________________   PT SHORT TERM GOAL #5   Title Complete 5x sit to stand and write appropriate STG and LTG_________________           PT Long Term Goals - 06/11/14 1638    PT LONG TERM GOAL #1   Title Verbalize understanding of fall prevention strategies in the home. Target: 08/10/14   PT LONG TERM GOAL #2   Title TUG goal_____________   PT LONG TERM GOAL #3   Title Increase Berg balance test to >45/56 for decreased fall risk______________________   PT LONG TERM GOAL #4   Title Demonstrate safe negotiation of stairs with single rail (right side going up) for improved safety with home access. Target: 08/10/14   PT LONG TERM GOAL #5   Title Gait speed goal:________________________               Plan - 06/11/14 1632    Clinical Impression Statement Pt  presents with no significant medical history but with notable regression in balance since November 2015. Pt is generally sedentary at home and has mild muscle weakness but greatest deficit is balance impairment. Pt also reports occasional dizziness--vertigo screen TBD.   Rehab Potential Good   PT Frequency 2x / week   PT Duration 8 weeks   PT Treatment/Interventions ADLs/Self Care Home Management;Therapeutic  activities;Patient/family education;DME Instruction;Therapeutic exercise;Gait training;Balance training;Neuromuscular re-education;Stair training;Functional mobility training  vestibular rehab   PT Next Visit Plan Complete 5x sit to stand, TUG, gait velocity and write appropriate goals: balance HEP to include: anterior posterior limits of stability, corner balance with eyes closed, tandem walking, high marching etc          G-Codes - 19-Jun-2014 1641    Functional Assessment Tool Used Sharlene Motts 36/56; frequent falls on stairs   Functional Limitation Mobility: Walking and moving around   Mobility: Walking and Moving Around Current Status ([id number]) At least 20 percent but less than 40 percent impaired, limited or restricted   Mobility: Walking and Moving Around Goal Status [phone]) At least 1 percent but less than 20 percent impaired, limited or restricted       Problem List There are no active problems to display for this patient.   Lamar Laundry D Jun 19, 2014, 4:47 PM  [facility] [address] [place], [place], [address] Phone: [phone]   Fax:  [phone]

## 2014-06-22 ENCOUNTER — Ambulatory Visit: Payer: Medicare Other

## 2014-06-25 ENCOUNTER — Ambulatory Visit: Payer: Medicare Other

## 2014-06-28 ENCOUNTER — Ambulatory Visit: Payer: Medicare Other

## 2014-06-28 DIAGNOSIS — R262 Difficulty in walking, not elsewhere classified: Secondary | ICD-10-CM

## 2014-06-28 DIAGNOSIS — R279 Unspecified lack of coordination: Secondary | ICD-10-CM | POA: Diagnosis not present

## 2014-06-28 NOTE — Therapy (Signed)
[facility] [address] [place], [place], [address] Phone: [phone]   Fax:  [phone]  Physical Therapy Treatment  Patient Details  Name: Jason York MRN: [id number] Date of Birth: June 06, 1918 Referring Provider:  Sherren Mocha, MD  Encounter Date: 06/28/2014      PT End of Session - 06/28/14 1625    Visit Number 2   Number of Visits 17   Date for PT Re-Evaluation 08/10/14   Authorization Type UHC Medicare-G code every 10th visit   PT Start Time 1450   PT Stop Time 1533   PT Time Calculation (min) 43 min      History reviewed. No pertinent past medical history.  Past Surgical History  Procedure Laterality Date  . Tonsillectomy    . Appendectomy      There were no vitals taken for this visit.  Visit Diagnosis:  Lack of coordination  Difficulty walking      Subjective Assessment - 06/28/14 1455    Symptoms Pt denies any falls.    Currently in Pain? No/denies          [facility] PT Assessment - 06/28/14 0001    Functional Tests   Functional tests --  5x sit to stand = 23.76s   Berg Balance Test   Sit to Stand Able to stand without using hands and stabilize independently   Standing Unsupported Able to stand safely 2 minutes   Sitting with Back Unsupported but Feet Supported on Floor or Stool Able to sit safely and securely 2 minutes   Stand to Sit Sits safely with minimal use of hands   Transfers Able to transfer safely, minor use of hands   Standing Unsupported with Eyes Closed Able to stand 10 seconds safely   Standing Ubsupported with Feet Together Needs help to attain position but able to stand for 30 seconds with feet together   From Standing, Reach Forward with Outstretched Arm Can reach forward >12 cm safely (5")   From Standing Position, Pick up Object from Floor Able to pick up shoe, needs supervision   From Standing Position, Turn to Look Behind Over each Shoulder Looks behind from both  sides and weight shifts well   Turn 360 Degrees Needs close supervision or verbal cueing   Standing Unsupported, Alternately Place Feet on Step/Stool Able to complete >2 steps/needs minimal assist   Standing Unsupported, One Foot in [employer] balance while stepping or standing   Standing on One Leg Tries to lift leg/unable to hold 3 seconds but remains standing independently   Total Score 38   Timed Up and Go Test   TUG Normal TUG   Normal TUG (seconds) 21      Pt completed Berg Balance test with improved score today than initial session. Pt completed TUG. And home exercise program to address balance impairments was taught and provided. See pt instructions for details.                    PT Education - 06/28/14 1624    Education provided Yes   Education Details HEP   Person(s) Educated Patient   Methods Explanation;Demonstration;Handout   Comprehension Verbalized understanding;Returned demonstration;Need further instruction          PT Short Term Goals - 06/28/14 1628    PT SHORT TERM GOAL #1   Title Demonstrate correct performance of HEP to address balance impairment. Target: 07/13/14   PT SHORT TERM GOAL #2  Title Improve score on Berg Balance Test to 40/56 for decreasing fall risk. Target 07/13/14   PT SHORT TERM GOAL #3   Title Decrease TUG time to 18 seconds for increased efficiency of funcitonal mobility. Target 07/13/14   PT SHORT TERM GOAL #4   Title Complete 5540m walk and write appropriate STG and LTG________________   PT SHORT TERM GOAL #5   Title Decrease 5x sit to stand time to 20.76 seconds without retropulsion for increased safety and efficiency of functional mobility. Target 07/13/14           PT Long Term Goals - 06/28/14 1631    PT LONG TERM GOAL #1   Title Verbalize understanding of fall prevention strategies in the home. Target: 08/10/14   PT LONG TERM GOAL #2   Title Decrease TUG time to 15 seconds for improved efficiency of functional  mobility. Target 08/10/14   PT LONG TERM GOAL #3   Title Increase Berg balance test to >45/56 for decreased fall risk Target 08/10/14   PT LONG TERM GOAL #4   Title Demonstrate safe negotiation of stairs with single rail (right side going up) for improved safety with home access. Target: 08/10/14   PT LONG TERM GOAL #5   Title Gait speed goal:________________________               Plan - 06/28/14 1625    Clinical Impression Statement Pt has not been seen sincle eval (2 weeks ago) until today due to personal scheduling conflicts and bad weather. Pt was taught HEP today. Will need to review this with wife as she will likely need to help him perform these at home.   PT Next Visit Plan 1340m walk and goal. Review HEP with wife. Muscle strengthening and balance.        Problem List There are no active problems to display for this patient.   Lamar LaundryAlderson, Adalay Azucena D 06/28/2014, 4:36 PM  [facility] [address] [place], [place], [address] Phone: [phone]   Fax:  [phone]

## 2014-06-28 NOTE — Patient Instructions (Signed)
Feet Apart (Compliant Surface) Arm Motion - Eyes Open   Stand on two pillows with your back to a corner and a chair in front of you wiith eyes open.Hold your balance for 30 seconds with just fingertip support on the chair in front of you. Repeat 3x daily. When this becomes easy, you can stop using your fingertip, then when that is easy, you can perform with eyes closed with a fingertip.  Copyright  VHI. All rights reserved.  Weight Shift: Anterior / Posterior (Limits of Stability)   Stand with your back to a corner and with a chair in front of you. Use fingertip support on the chair as needed. Slowly shift weight backward until toes begin to rise off floor. Return to starting position. Shift weight slowly forward until heels begin to rise off floor. Hold each position 2 seconds. Repeat 20 times per session. Do 1 sessions per day. R  Copyright  VHI. All rights reserved.    Marching While Walking   Holding counter for stability march forward with high knees. When you reach the end of the counter, march backward. Perform two times per day.   http://gt2.exer.us/767   Copyright  VHI. All rights reserved.     Tandem Walking  Holding counter for stability walk heel to toe (like the sobriety test) When you reach the end of the counter, perform same walk backward. Perform two times per day along your counter top.   Copyright  VHI. All rights reserved.     PELVIC TILT: Anterior   Start in slumped position. Roll pelvis forward to arch back. ___ reps per set, ___ sets per day, ___ days per week   Copyright  VHI. All rights reserved.

## 2014-07-02 ENCOUNTER — Ambulatory Visit: Payer: Medicare Other | Attending: Family Medicine

## 2014-07-02 DIAGNOSIS — R262 Difficulty in walking, not elsewhere classified: Secondary | ICD-10-CM | POA: Insufficient documentation

## 2014-07-02 DIAGNOSIS — R279 Unspecified lack of coordination: Secondary | ICD-10-CM | POA: Insufficient documentation

## 2014-07-04 ENCOUNTER — Ambulatory Visit: Payer: Medicare Other

## 2014-07-04 DIAGNOSIS — R279 Unspecified lack of coordination: Secondary | ICD-10-CM

## 2014-07-04 DIAGNOSIS — R262 Difficulty in walking, not elsewhere classified: Secondary | ICD-10-CM | POA: Diagnosis not present

## 2014-07-04 NOTE — Therapy (Signed)
[facility] [address] [place], [place], [address] Phone: [phone]   Fax:  [phone]  Physical Therapy Treatment  Patient Details  Name: Jason York MRN: [id number] Date of Birth: 03/16/1919 Referring Provider:  Sherren MochaShaw, Eva N, MD  Encounter Date: 07/04/2014      PT End of Session - 07/04/14 1631    Visit Number 3   Number of Visits 17   Date for PT Re-Evaluation 08/10/14   Authorization Type UHC Medicare-G code every 10th visit   PT Start Time 1450   PT Stop Time 1530   PT Time Calculation (min) 40 min      No past medical history on file.  Past Surgical History  Procedure Laterality Date  . Tonsillectomy    . Appendectomy      There were no vitals taken for this visit.  Visit Diagnosis:  Lack of coordination  Difficulty walking      Subjective Assessment - 07/04/14 1456    Symptoms Pt's wife refused to come into the gym to learn pt's HEP. Pt no-showed to appointment earlier this week and he report that he didn't realize he had therapy 2x/week. Pt's wifre reports he is not doing HEP.;   Currently in Pain? No/denies      Neuro re-ed: Reviewed HEP with constat verbal cues required. Performed all exercises as indicated in handout from previous sessoin.  Standing on two stacked pillows witheyes closed and head turns horizontal and vertical x20 each with constant verbal cues to keep eyes closed.  Gait training: 10 meter walk with small based quad cane (pt's cane) 42.44 seconds = 0.77 ft/sec Training with sequencing of cane with verbal cues and visual cues in parallel bars with mirror and on tile without visual cue, with close supervision and improved sequencing noted with training. Pt appears more steady and confident with straight cane, but reported he prefers the quad cane. 6353m walk with straight cane 51.13 = 0.64 ft/sec                         PT Short Term Goals - 07/04/14  1655    PT SHORT TERM GOAL #1   Title Demonstrate correct performance of HEP to address balance impairment. Target: 07/13/14   PT SHORT TERM GOAL #2   Title Improve score on Berg Balance Test to 40/56 for decreasing fall risk. Target 07/13/14   PT SHORT TERM GOAL #3   Title Decrease TUG time to 18 seconds for increased efficiency of funcitonal mobility. Target 07/13/14   PT SHORT TERM GOAL #4   Title Increase gait speed to 1.0 ft/sec with LRAD for improved efficiency of mobility. Target 07/13/14   PT SHORT TERM GOAL #5   Title Decrease 5x sit to stand time to 20.76 seconds without retropulsion for increased safety and efficiency of functional mobility. Target 07/13/14           PT Long Term Goals - 07/04/14 1655    PT LONG TERM GOAL #1   Title Verbalize understanding of fall prevention strategies in the home. Target: 08/10/14   PT LONG TERM GOAL #2   Title Decrease TUG time to 15 seconds for improved efficiency of functional mobility. Target 08/10/14   PT LONG TERM GOAL #3   Title Increase Berg balance test to >45/56 for decreased fall risk Target 08/10/14   PT LONG TERM GOAL #4   Title Demonstrate safe negotiation of stairs with  single rail (right side going up) for improved safety with home access. Target: 08/10/14   PT LONG TERM GOAL #5   Title Increase gait speed to 1.3 ft/sec for limited community ambulator status. Target 08/10/14               Plan - 07/04/14 1647    Clinical Impression Statement Pt was seen today for his 3rd visit. MIssed monday due to wife forgetting. Pt is not performing HEP but is very participatory during therapy. Expected to make progress if he attends sessions more regularly.   PT Next Visit Plan Strength/balance        Problem List There are no active problems to display for this patient.   Lamar Laundry D 07/04/2014, 4:56 PM  [facility] [address] [place], [place],  [address] Phone: [phone]   Fax:  [phone]

## 2014-07-09 ENCOUNTER — Ambulatory Visit: Payer: Medicare Other

## 2014-07-09 DIAGNOSIS — R279 Unspecified lack of coordination: Secondary | ICD-10-CM | POA: Diagnosis not present

## 2014-07-09 DIAGNOSIS — R262 Difficulty in walking, not elsewhere classified: Secondary | ICD-10-CM

## 2014-07-09 NOTE — Therapy (Signed)
[facility] [address] [place], [place], [address] Phone: [phone]   Fax:  [phone]  Physical Therapy Treatment  Patient Details  Name: Jason York MRN: [id number] Date of Birth: [birth year] Referring Provider:  Sherren Mocha, MD  Encounter Date: 07/09/2014      PT End of Session - 07/09/14 1630    Visit Number 4   Number of Visits 17   Date for PT Re-Evaluation 08/10/14   Authorization Type UHC Medicare-G code every 10th visit   PT Start Time 1456   PT Stop Time 1532   PT Time Calculation (min) 36 min      History reviewed. No pertinent past medical history.  Past Surgical History  Procedure Laterality Date  . Tonsillectomy    . Appendectomy      There were no vitals taken for this visit.  Visit Diagnosis:  Lack of coordination  Difficulty walking      Subjective Assessment - 07/09/14 1459    Symptoms Pt reports he did his home exercises a little bit and they went ok.   Currently in Pain? No/denies      - Gait training without assistive device with MIN A to promote increased gait speed and verbal cues for upright posture and increased step length. 2x230' and 1x175' with cane pt noted to have a shuffling gait pattern with poor foot clearance and minimal carry over from previous session regarding proper sequencing of cane. -turning while walking-10 x at counter with supervison and verbal cues for foot clearance -3 laps at counter top attempting heel walking with poor performance noted -Negotiating 1"x4" obstacles with forward and lateral stepping with emphasis on large steps to fully clear the objects. Pt was unable.  10x sit to stand without UE support with forward reach for improved balance and decreased retropulsion with sit to stand.                        PT Short Term Goals - 07/04/14 1655    PT SHORT TERM GOAL #1   Title Demonstrate correct performance of HEP to  address balance impairment. Target: 07/13/14   PT SHORT TERM GOAL #2   Title Improve score on Berg Balance Test to 40/56 for decreasing fall risk. Target 07/13/14   PT SHORT TERM GOAL #3   Title Decrease TUG time to 18 seconds for increased efficiency of funcitonal mobility. Target 07/13/14   PT SHORT TERM GOAL #4   Title Increase gait speed to 1.0 ft/sec with LRAD for improved efficiency of mobility. Target 07/13/14   PT SHORT TERM GOAL #5   Title Decrease 5x sit to stand time to 20.76 seconds without retropulsion for increased safety and efficiency of functional mobility. Target 07/13/14           PT Long Term Goals - 07/04/14 1655    PT LONG TERM GOAL #1   Title Verbalize understanding of fall prevention strategies in the home. Target: 08/10/14   PT LONG TERM GOAL #2   Title Decrease TUG time to 15 seconds for improved efficiency of functional mobility. Target 08/10/14   PT LONG TERM GOAL #3   Title Increase Berg balance test to >45/56 for decreased fall risk Target 08/10/14   PT LONG TERM GOAL #4   Title Demonstrate safe negotiation of stairs with single rail (right side going up) for improved safety with home access. Target: 08/10/14   PT LONG TERM GOAL #  5   Title Increase gait speed to 1.3 ft/sec for limited community ambulator status. Target 08/10/14               Plan - 07/09/14 1631    Clinical Impression Statement Pt demonstrates cognitive impairment with inconsistent response to verbal cues. Pt's wife will not come into the gym for pt's therapy session. Continue per plan of care.   PT Next Visit Plan Begin checking STGs.Strength/balance        Problem List There are no active problems to display for this patient.   Lamar LaundryAlderson, Adilynne Fitzwater D 07/09/2014, 4:38 PM  [facility] [address] [place], [place], [address] Phone: [phone]   Fax:  [phone]

## 2014-07-11 ENCOUNTER — Ambulatory Visit: Payer: Medicare Other

## 2014-07-11 DIAGNOSIS — R279 Unspecified lack of coordination: Secondary | ICD-10-CM | POA: Diagnosis not present

## 2014-07-11 DIAGNOSIS — R262 Difficulty in walking, not elsewhere classified: Secondary | ICD-10-CM

## 2014-07-11 NOTE — Therapy (Signed)
[facility] [address] [place], [place], [address] Phone: [phone]   Fax:  [phone]  Physical Therapy Treatment  Patient Details  Name: Jason York MRN: [id number] Date of Birth: 1919/05/17 Referring Provider:  Shawnee Knapp, MD  Encounter Date: 07/11/2014      PT End of Session - 07/11/14 1537    Visit Number 5   Number of Visits 17   Date for PT Re-Evaluation 08/10/14   Authorization Type UHC [place] code every 10th visit   PT Start Time 1450   PT Stop Time 1530   PT Time Calculation (min) 40 min      History reviewed. No pertinent past medical history.  Past Surgical History  Procedure Laterality Date  . Tonsillectomy    . Appendectomy      There were no vitals taken for this visit.  Visit Diagnosis:  Lack of coordination  Difficulty walking      Subjective Assessment - 07/11/14 1456    Symptoms It's cold out there!   Currently in Pain? No/denies      "Power" walking x230' with close supervision and pt noted to shuffle feet. Verbal cues to correct, but pt unable.  Retro walking with CGA x90' with pt losing balance requiring MOD A on one occasion. Continues to shuffle.  Forward/backward stepping over 2"x4" obstacle with single UE support initially with shoes on and pt reporting "these shoes are too big" then performed without shoes with equal performance. Pt requires maximal verbal cues to keep head up and spine erect.  Lateral stepping over 2"x4" obstacle with single supper extremity support and CGA  Lateral stepping on foam balance beam with MOD A to steady and verbal cues for posture.  Anterior/posterior limites of stability on airex with MIN A to steady and single UE support; progressed to rockerboard and able to perform with MIN A and no upper extremity support  5x sit to stand: 21.13 seconds with retropulsion demonstrated  TUG 18.94 seconds without assistive device.  *Plan  to d/c next week due to minimal progress, limited support, and not performing HEP.                    PT Short Term Goals - 07/11/14 1539    PT SHORT TERM GOAL #1   Title Demonstrate correct performance of HEP to address balance impairment. Target: 07/13/14   Status Not Met   PT SHORT TERM GOAL #2   Title Improve score on Berg Balance Test to 40/56 for decreasing fall risk. Target 07/13/14   PT SHORT TERM GOAL #3   Title Decrease TUG time to 18 seconds for increased efficiency of funcitonal mobility. Target 07/13/14   Status Not Met   PT SHORT TERM GOAL #4   Title Increase gait speed to 1.0 ft/sec with LRAD for improved efficiency of mobility. Target 07/13/14   PT SHORT TERM GOAL #5   Title Decrease 5x sit to stand time to 20.76 seconds without retropulsion for increased safety and efficiency of functional mobility. Target 07/13/14   Status Not Met           PT Long Term Goals - 07/04/14 1655    PT LONG TERM GOAL #1   Title Verbalize understanding of fall prevention strategies in the home. Target: 08/10/14   PT LONG TERM GOAL #2   Title Decrease TUG time to 15 seconds for improved efficiency of functional mobility. Target 08/10/14   PT LONG TERM  GOAL #3   Title Increase Berg balance test to >45/56 for decreased fall risk Target 08/10/14   PT LONG TERM GOAL #4   Title Demonstrate safe negotiation of stairs with single rail (right side going up) for improved safety with home access. Target: 08/10/14   PT LONG TERM GOAL #5   Title Increase gait speed to 1.3 ft/sec for limited community ambulator status. Target 08/10/14               Plan - 07/11/14 1538    Clinical Impression Statement Pt making limited progress due to limited support and non-participation in HEP. Will plan to d/c next week   PT Next Visit Plan Finish checking STGs and begin checking LTGs, plan for d/c on 07/19/14        Problem List There are no active problems to display for this  patient.   Delrae Sawyers D 07/11/2014, 3:45 PM  [facility] [address] [place], [place], [address] Phone: [phone]   Fax:  [phone]

## 2014-07-17 ENCOUNTER — Ambulatory Visit: Payer: Medicare Other

## 2014-07-19 ENCOUNTER — Ambulatory Visit: Payer: Medicare Other

## 2014-07-19 DIAGNOSIS — R262 Difficulty in walking, not elsewhere classified: Secondary | ICD-10-CM

## 2014-07-19 DIAGNOSIS — R279 Unspecified lack of coordination: Secondary | ICD-10-CM

## 2014-07-19 NOTE — Patient Instructions (Signed)

## 2014-07-19 NOTE — Therapy (Signed)
[facility] [address] [place], [place], [address] Phone: [phone]   Fax:  [phone]  Physical Therapy Treatment  Patient Details  Name: Jason York MRN: [id number] Date of Birth: [birth year] Referring Provider:  Shawnee Knapp, MD  Encounter Date: 07/19/2014      PT End of Session - 07/19/14 1656    Visit Number 6   Number of Visits 17   Date for PT Re-Evaluation 08/10/14   Authorization Type UHC [place] code every 10th visit   PT Start Time 1448   PT Stop Time 1532   PT Time Calculation (min) 44 min      History reviewed. No pertinent past medical history.  Past Surgical History  Procedure Laterality Date  . Tonsillectomy    . Appendectomy      There were no vitals taken for this visit.  Visit Diagnosis:  Lack of coordination  Difficulty walking      Subjective Assessment - 07/19/14 1455    Symptoms "I'm feeling pretty good today!"   Currently in Pain? No/denies          [facility] PT Assessment - 07/19/14 0001    Ambulation/Gait   Ambulation/Gait Yes   Gait velocity 1.09 ft/sec   Berg Balance Test   Sit to Stand Able to stand without using hands and stabilize independently   Standing Unsupported Able to stand safely 2 minutes   Sitting with Back Unsupported but Feet Supported on Floor or Stool Able to sit safely and securely 2 minutes   Stand to Sit Sits safely with minimal use of hands   Transfers Able to transfer safely, minor use of hands   Standing Unsupported with Eyes Closed Able to stand 10 seconds safely   Standing Ubsupported with Feet Together Able to place feet together independently and stand for 1 minute with supervision   From Standing, Reach Forward with Outstretched Arm Can reach forward >12 cm safely (5")   From Standing Position, Pick up Object from Floor Able to pick up shoe safely and easily   From Standing Position, Turn to Look Behind Over each Shoulder Looks behind  from both sides and weight shifts well   Turn 360 Degrees Able to turn 360 degrees safely but slowly   Standing Unsupported, Alternately Place Feet on Step/Stool Able to complete 4 steps without aid or supervision   Standing Unsupported, One Foot in Front Able to plae foot ahead of the other independently and hold 30 seconds   Standing on One Leg Able to lift leg independently and hold 5-10 seconds   Total Score 48   Timed Up and Go Test   TUG Normal TUG   Normal TUG (seconds) 17.59  without assistive device      Checked all long term goals. See info above on TUG, BERG, Gait speed. Discussed progress toward goals with pt and wife. They verbalized understanding.  Self care: educated pt and wife on fall prevention. Handout provided                    PT Education - 07/19/14 1656    Education provided Yes   Education Details fall prevention ed   [employer]) Educated Patient;Spouse   Methods Explanation;Handout   Comprehension Verbalized understanding          PT Short Term Goals - 07/19/14 1658    PT SHORT TERM GOAL #1   Title Demonstrate correct performance of HEP to address balance impairment.  Target: 07/13/14   Status Not Met   PT SHORT TERM GOAL #2   Title Improve score on Berg Balance Test to 40/56 for decreasing fall risk. Target 07/13/14   Status Achieved   PT SHORT TERM GOAL #3   Title Decrease TUG time to 18 seconds for increased efficiency of funcitonal mobility. Target 07/13/14   Status Not Met   PT SHORT TERM GOAL #4   Title Increase gait speed to 1.0 ft/sec with LRAD for improved efficiency of mobility. Target 07/13/14   Status Achieved   PT SHORT TERM GOAL #5   Title Decrease 5x sit to stand time to 20.76 seconds without retropulsion for increased safety and efficiency of functional mobility. Target 07/13/14   Status Not Met           PT Long Term Goals - 08-12-14 1700    PT LONG TERM GOAL #1   Title Verbalize understanding of fall prevention  strategies in the home. Target: 08/10/14   Status Achieved   PT LONG TERM GOAL #2   Title Decrease TUG time to 15 seconds for improved efficiency of functional mobility. Target 08/10/14   Status Not Met  TUG 17.59 seconds   PT LONG TERM GOAL #3   Title Increase Berg balance test to >45/56 for decreased fall risk Target 08/10/14   Status Achieved  Berg 48/56   PT LONG TERM GOAL #4   Title Demonstrate safe negotiation of stairs with single rail (right side going up) for improved safety with home access. Target: 08/10/14   Status Achieved   PT LONG TERM GOAL #5   Title Increase gait speed to 1.3 ft/sec for limited community ambulator status. Target 08/10/14   Status Not Met  gait speed 1.09 ft/sec               Plan - 2014/08/12 1657    Clinical Impression Statement Pt made some progress, met some therapy goals and wife and pt both noticed some improvement in mobility and independence at home. However his progress has been limited by lack of performance of HEP. D/C today   PT Next Visit Plan d/c today   Consulted and Agree with Plan of Care Patient;Family member/caregiver          G-Codes - 08-12-2014 1702    Functional Assessment Tool Used Merrilee Jansky 48/56   Functional Limitation Mobility: Walking and moving around   Mobility: Walking and Moving Around Goal Status [phone]) At least 1 percent but less than 20 percent impaired, limited or restricted   Mobility: Walking and Moving Around Discharge Status [phone]) At least 1 percent but less than 20 percent impaired, limited or restricted      Problem List There are no active problems to display for this patient.  Delrae Sawyers, PT,DPT,NCS 08/12/14 5:13 PM Phone [phone] FAX [phone]         [facility] [address] [place], [place], [address] Phone: [phone]   Fax:  [phone]   PHYSICAL THERAPY DISCHARGE SUMMARY  Visits from Start of Care:  6  Current functional level related to goals / functional outcomes: Pt made progress toward goals. See goals section above. Met 3/5 long term goals.   Remaining deficits: Fall risk (although this has decreased); gait impairment with pt continuing to shuffle feet which presents risk of falling, decreased safety awareness most notable with pt nearly colliding with objects while ambulating and wife reporting that pt chooses to negotiate back porch stairs  with no rail when he is not supervised.   Education / Equipment: Fall prevention education, home exercise program  Plan: Patient agrees to discharge.  Patient goals were partially met. Patient is being discharged due to meeting the stated rehab goals. and plateau in progress.  ?????

## 2015-04-11 ENCOUNTER — Ambulatory Visit (INDEPENDENT_AMBULATORY_CARE_PROVIDER_SITE_OTHER): Payer: Medicare Other | Admitting: Family Medicine

## 2015-04-11 VITALS — BP 134/73 | HR 66 | Temp 97.2°F | Resp 16 | Wt 123.0 lb

## 2015-04-11 DIAGNOSIS — Z23 Encounter for immunization: Secondary | ICD-10-CM

## 2015-04-11 DIAGNOSIS — R600 Localized edema: Secondary | ICD-10-CM | POA: Diagnosis not present

## 2015-04-11 MED ORDER — FUROSEMIDE 20 MG PO TABS: 20.0000 mg | ORAL_TABLET | Freq: Every day | ORAL | Status: AC

## 2015-04-11 NOTE — Progress Notes (Signed)
Subjective:    Patient ID: Jason York, male    DOB: 10/17/1918, 81 y.o.   MRN: [id number]   Chief Complaint  Patient presents with  . Leg Swelling     HPI Swelling in ankles legs and feet for 1-2 mos. Has had mulitple falls but no injuuries. No pain in feet. Shoes not feeing. Not sleeping with legs elevated, sleeps sitting up in chair as most comfortable for him. Not better  Pt voluntarily agreed to stop all his medications last yr with the support of his wife. Initially he was doing better but wife now concerned that he is much less active - always sleeps in a chair and usually slides out onto the floor o/n.  Rarely lays in bed. Wife having much more difficulty helping him up after falls.   No past medical history on file. Past Surgical History  Procedure Laterality Date  . Tonsillectomy    . Appendectomy     No current outpatient prescriptions on file prior to visit.   No current facility-administered medications on file prior to visit.   No Known Allergies Family History  Problem Relation Age of Onset  . Stroke Sister    Social History   Social History  . Marital Status: Married    Spouse Name: N/A  . Number of Children: N/A  . Years of Education: N/A   Social History Main Topics  . Smoking status: Never Smoker   . Smokeless tobacco: Never Used  . Alcohol Use: 1.2 - 1.8 oz/week    2-3 Standard drinks or equivalent per week     Comment: occassional  . Drug Use: No  . Sexual Activity: Not Currently   Other Topics Concern  . Not on file   Social History Narrative     Review of Systems  Constitutional: Positive for activity change and fatigue. Negative for fever, chills, appetite change and unexpected weight change.  Respiratory: Negative for apnea, cough, chest tightness and shortness of breath.   Cardiovascular: Positive for leg swelling. Negative for chest pain and palpitations.  Neurological: Positive for weakness.  Hematological: Negative for  adenopathy. Bruises/bleeds easily.  Psychiatric/Behavioral: Positive for sleep disturbance.       Objective:  BP 134/73 mmHg  Pulse 66  Temp(Src) 97.2 F (36.2 C)  Resp 16  Wt 123 lb (55.792 kg)  Physical Exam  Constitutional: He is oriented to person, place, and time. He appears well-developed and well-nourished. No distress.  HENT:  Head: Normocephalic and atraumatic.  Eyes: Conjunctivae are normal. Pupils are equal, round, and reactive to light. No scleral icterus.  Neck: Normal range of motion. Neck supple. No thyromegaly present.  Cardiovascular: Normal rate, regular rhythm, normal heart sounds and intact distal pulses.   2 + bilateral lower ext pedal edema  Pulmonary/Chest: Effort normal and breath sounds normal. No respiratory distress.  Musculoskeletal: He exhibits no edema.  Lymphadenopathy:    He has no cervical adenopathy.  Neurological: He is alert and oriented to person, place, and time.  Skin: Skin is warm and dry. He is not diaphoretic.  Psychiatric: He has a normal mood and affect. His behavior is normal.          Assessment & Plan:   1. Need for immunization against influenza   2. Bilateral edema of lower extremity   check bnp to r/o chf exac - unlikely as no other sxs such as orthopnea, sacral edema, no rales on exam.  Start trial of low dose prn  lasix - monitor weight at home and calll or RTC if gains >3 lbs o/n or >5 lbs 1 wk so lasix can be adjusted - wife understands and agrees. If persists, RTC for further eval. High K diet.   Orders Placed This Encounter  Procedures  . Flu Vaccine QUAD 36+ mos IM (Fluarix)  . Basic metabolic panel    Order Specific Question:  Has the patient fasted?    Answer:  No  . Pro b natriuretic peptide    Meds ordered this encounter  Medications  . furosemide (LASIX) 20 MG tablet    Sig: Take 1 tablet (20 mg total) by mouth daily. As directed by physician    Dispense:  30 tablet    Refill:  1    Norberto Sorenson, MD  MPH  Results for orders placed or performed in visit on 04/11/15  Basic metabolic panel  Result Value Ref Range   Sodium 138 135 - 146 mmol/L   Potassium 4.3 3.5 - 5.3 mmol/L   Chloride 101 98 - 110 mmol/L   CO2 29 20 - 31 mmol/L   Glucose, Bld 101 (H) 65 - 99 mg/dL   BUN 17 7 - 25 mg/dL   Creat 2.13 0.86 - 5.78 mg/dL   Calcium 9.2 8.6 - 46.9 mg/dL  Pro b natriuretic peptide  Result Value Ref Range   Pro B Natriuretic peptide (BNP) 1715.00 (H) <451 pg/mL

## 2015-04-11 NOTE — Patient Instructions (Signed)
Take one of the lasix every morning for 2 weeks. Try to walk more. Drink plenty of water.  Try to elevate your legs more.  Try to lay back or lean back at night (and during the day).  Wear knee high socks. Make sure you eat several foods with potassium in it every day.  Peripheral Edema You have swelling in your legs (peripheral edema). This swelling is due to excess accumulation of salt and water in your body. Edema may be a sign of heart, kidney or liver disease, or a side effect of a medication. It may also be due to problems in the leg veins. Elevating your legs and using special support stockings may be very helpful, if the cause of the swelling is due to poor venous circulation. Avoid long periods of standing, whatever the cause. Treatment of edema depends on identifying the cause. Chips, pretzels, pickles and other salty foods should be avoided. Restricting salt in your diet is almost always needed. Water pills (diuretics) are often used to remove the excess salt and water from your body via urine. These medicines prevent the kidney from reabsorbing sodium. This increases urine flow. Diuretic treatment may also result in lowering of potassium levels in your body. Potassium supplements may be needed if you have to use diuretics daily. Daily weights can help you keep track of your progress in clearing your edema. You should call your caregiver for follow up care as recommended. SEEK IMMEDIATE MEDICAL CARE IF:   You have increased swelling, pain, redness, or heat in your legs.  You develop shortness of breath, especially when lying down.  You develop chest or abdominal pain, weakness, or fainting.  You have a fever.   This information is not intended to replace advice given to you by your health care provider. Make sure you discuss any questions you have with your health care provider.   Document Released: 06/25/2004 Document Revised: 08/10/2011 Document Reviewed: 11/28/2014 Elsevier  Interactive Patient Education 2016 Elsevier Inc.  Potassium Content of Foods Potassium is a mineral found in many foods and drinks. It helps keep fluids and minerals balanced in your body and affects how steadily your heart beats. Potassium also helps control your blood pressure and keep your muscles and nervous system healthy. Certain health conditions and medicines may change the balance of potassium in your body. When this happens, you can help balance your level of potassium through the foods that you do or do not eat. Your health care provider or dietitian may recommend an amount of potassium that you should have each day. The following lists of foods provide the amount of potassium (in parentheses) per serving in each item. HIGH IN POTASSIUM  The following foods and beverages have 200 mg or more of potassium per serving:  Apricots, 2 raw or 5 dry (200 mg).  Artichoke, 1 medium (345 mg).  Avocado, raw,  each (245 mg).  Banana, 1 medium (425 mg).  Beans, lima, or baked beans, canned,  cup (280 mg).  Beans, white, canned,  cup (595 mg).  Beef roast, 3 oz (320 mg).  Beef, ground, 3 oz (270 mg).  Beets, raw or cooked,  cup (260 mg).  Bran muffin, 2 oz (300 mg).  Broccoli,  cup (230 mg).  Brussels sprouts,  cup (250 mg).  Cantaloupe,  cup (215 mg).  Cereal, 100% bran,  cup (200-400 mg).  Cheeseburger, single, fast food, 1 each (225-400 mg).  Chicken, 3 oz (220 mg).  Clams, canned, 3  oz (535 mg).  Crab, 3 oz (225 mg).  Dates, 5 each (270 mg).  Dried beans and peas,  cup (300-475 mg).  Figs, dried, 2 each (260 mg).  Fish: halibut, tuna, cod, snapper, 3 oz (480 mg).  Fish: salmon, haddock, swordfish, perch, 3 oz (300 mg).  Fish, tuna, canned 3 oz (200 mg).  [place] fries, fast food, 3 oz (470 mg).  Granola with fruit and nuts,  cup (200 mg).  Grapefruit juice,  cup (200 mg).  Greens, beet,  cup (655 mg).  Honeydew melon,  cup (200  mg).  Kale, raw, 1 cup (300 mg).  Kiwi, 1 medium (240 mg).  Kohlrabi, rutabaga, parsnips,  cup (280 mg).  Lentils,  cup (365 mg).  Mango, 1 each (325 mg).  Milk, chocolate, 1 cup (420 mg).  Milk: nonfat, low-fat, whole, buttermilk, 1 cup (350-380 mg).  Molasses, 1 Tbsp (295 mg).  Mushrooms,  cup (280) mg.  Nectarine, 1 each (275 mg).  Nuts: almonds, peanuts, hazelnuts, [place], cashew, mixed, 1 oz (200 mg).  Nuts, pistachios, 1 oz (295 mg).  Orange, 1 each (240 mg).  Orange juice,  cup (235 mg).  Papaya, medium,  fruit (390 mg).  Peanut butter, chunky, 2 Tbsp (240 mg).  Peanut butter, smooth, 2 Tbsp (210 mg).  Pear, 1 medium (200 mg).  Pomegranate, 1 whole (400 mg).  Pomegranate juice,  cup (215 mg).  Pork, 3 oz (350 mg).  Potato chips, salted, 1 oz (465 mg).  Potato, baked with skin, 1 medium (925 mg).  Potatoes, boiled,  cup (255 mg).  Potatoes, mashed,  cup (330 mg).  Prune juice,  cup (370 mg).  Prunes, 5 each (305 mg).  Pudding, chocolate,  cup (230 mg).  Pumpkin, canned,  cup (250 mg).  Raisins, seedless,  cup (270 mg).  Seeds, sunflower or pumpkin, 1 oz (240 mg).  Soy milk, 1 cup (300 mg).  Spinach,  cup (420 mg).  Spinach, canned,  cup (370 mg).  Sweet potato, baked with skin, 1 medium (450 mg).  Swiss chard,  cup (480 mg).  Tomato or vegetable juice,  cup (275 mg).  Tomato sauce or puree,  cup (400-550 mg).  Tomato, raw, 1 medium (290 mg).  Tomatoes, canned,  cup (200-300 mg).  [place]key, 3 oz (250 mg).  Wheat germ, 1 oz (250 mg).  Winter squash,  cup (250 mg).  Yogurt, plain or fruited, 6 oz (260-435 mg).  Zucchini,  cup (220 mg). MODERATE IN POTASSIUM The following foods and beverages have 50-200 mg of potassium per serving:  Apple, 1 each (150 mg).  Apple juice,  cup (150 mg).  Applesauce,  cup (90 mg).  Apricot nectar,  cup (140 mg).  Asparagus, small spears,  cup or 6 spears (155  mg).  Bagel, cinnamon raisin, 1 each (130 mg).  Bagel, egg or plain, 4 in., 1 each (70 mg).  Beans, green,  cup (90 mg).  Beans, yellow,  cup (190 mg).  Beer, regular, 12 oz (100 mg).  Beets, canned,  cup (125 mg).  Blackberries,  cup (115 mg).  Blueberries,  cup (60 mg).  Bread, whole wheat, 1 slice (70 mg).  Broccoli, raw,  cup (145 mg).  Cabbage,  cup (150 mg).  Carrots, cooked or raw,  cup (180 mg).  Cauliflower, raw,  cup (150 mg).  Celery, raw,  cup (155 mg).  Cereal, bran flakes, cup (120-150 mg).  Cheese, cottage,  cup (110 mg).  Cherries, 10 each (  150 mg).  Chocolate, 1 oz bar (165 mg).  Coffee, brewed 6 oz (90 mg).  Corn,  cup or 1 ear (195 mg).  Cucumbers,  cup (80 mg).  Egg, large, 1 each (60 mg).  Eggplant,  cup (60 mg).  Endive, raw, cup (80 mg).  English muffin, 1 each (65 mg).  Fish, orange roughy, 3 oz (150 mg).  Frankfurter, beef or pork, 1 each (75 mg).  Fruit cocktail,  cup (115 mg).  Grape juice,  cup (170 mg).  Grapefruit,  fruit (175 mg).  Grapes,  cup (155 mg).  Greens: kale, turnip, collard,  cup (110-150 mg).  Ice cream or frozen yogurt, chocolate,  cup (175 mg).  Ice cream or frozen yogurt, vanilla,  cup (120-150 mg).  Lemons, limes, 1 each (80 mg).  Lettuce, all types, 1 cup (100 mg).  Mixed vegetables,  cup (150 mg).  Mushrooms, raw,  cup (110 mg).  Nuts: walnuts, pecans, or macadamia, 1 oz (125 mg).  Oatmeal,  cup (80 mg).  Okra,  cup (110 mg).  Onions, raw,  cup (120 mg).  Peach, 1 each (185 mg).  Peaches, canned,  cup (120 mg).  Pears, canned,  cup (120 mg).  Peas, green, frozen,  cup (90 mg).  Peppers, green,  cup (130 mg).  Peppers, red,  cup (160 mg).  Pineapple juice,  cup (165 mg).  Pineapple, fresh or canned,  cup (100 mg).  Plums, 1 each (105 mg).  Pudding, vanilla,  cup (150 mg).  Raspberries,  cup (90 mg).  Rhubarb,  cup (115  mg).  Rice, wild,  cup (80 mg).  Shrimp, 3 oz (155 mg).  Spinach, raw, 1 cup (170 mg).  Strawberries,  cup (125 mg).  Summer squash  cup (175-200 mg).  Swiss chard, raw, 1 cup (135 mg).  Tangerines, 1 each (140 mg).  Tea, brewed, 6 oz (65 mg).  Turnips,  cup (140 mg).  Watermelon,  cup (85 mg).  Wine, red, table, 5 oz (180 mg).  Wine, white, table, 5 oz (100 mg). LOW IN POTASSIUM The following foods and beverages have less than 50 mg of potassium per serving.  Bread, white, 1 slice (30 mg).  Carbonated beverages, 12 oz (less than 5 mg).  Cheese, 1 oz (20-30 mg).  Cranberries,  cup (45 mg).  Cranberry juice cocktail,  cup (20 mg).  Fats and oils, 1 Tbsp (less than 5 mg).  Hummus, 1 Tbsp (32 mg).  Nectar: papaya, mango, or pear,  cup (35 mg).  Rice, white or brown,  cup (50 mg).  Spaghetti or macaroni,  cup cooked (30 mg).  Tortilla, flour or corn, 1 each (50 mg).  Waffle, 4 in., 1 each (50 mg).  Water chestnuts,  cup (40 mg).   This information is not intended to replace advice given to you by your health care provider. Make sure you discuss any questions you have with your health care provider.   Document Released: 12/30/2004 Document Revised: 05/23/2013 Document Reviewed: 04/14/2013 Elsevier Interactive Patient Education [employer]

## 2015-04-12 LAB — BASIC METABOLIC PANEL
BUN: 17 mg/dL (ref 7–25)
CHLORIDE: 101 mmol/L (ref 98–110)
CO2: 29 mmol/L (ref 20–31)
Calcium: 9.2 mg/dL (ref 8.6–10.3)
Creat: 0.9 mg/dL (ref 0.70–1.11)
GLUCOSE: 101 mg/dL — AB (ref 65–99)
POTASSIUM: 4.3 mmol/L (ref 3.5–5.3)
SODIUM: 138 mmol/L (ref 135–146)

## 2015-04-13 LAB — PRO B NATRIURETIC PEPTIDE: Pro B Natriuretic peptide (BNP): 1715 pg/mL — ABNORMAL HIGH (ref <451)

## 2015-04-15 ENCOUNTER — Encounter: Payer: Self-pay | Admitting: Family Medicine

## 2015-04-22 ENCOUNTER — Inpatient Hospital Stay (HOSPITAL_COMMUNITY)
Admission: EM | Admit: 2015-04-22 | Discharge: 2015-04-24 | DRG: 292 | Disposition: A | Discharge status: Skilled Nursing Facility | Payer: Medicare Other | Attending: Family Medicine | Admitting: Family Medicine

## 2015-04-22 ENCOUNTER — Emergency Department (HOSPITAL_COMMUNITY): Payer: Medicare Other

## 2015-04-22 ENCOUNTER — Encounter (HOSPITAL_COMMUNITY): Payer: Self-pay | Admitting: *Deleted

## 2015-04-22 ENCOUNTER — Encounter: Payer: Self-pay | Admitting: Family Medicine

## 2015-04-22 DIAGNOSIS — I10 Essential (primary) hypertension: Secondary | ICD-10-CM

## 2015-04-22 DIAGNOSIS — R0902 Hypoxemia: Secondary | ICD-10-CM | POA: Diagnosis present

## 2015-04-22 DIAGNOSIS — F0391 Unspecified dementia with behavioral disturbance: Secondary | ICD-10-CM | POA: Diagnosis not present

## 2015-04-22 DIAGNOSIS — R627 Adult failure to thrive: Secondary | ICD-10-CM | POA: Diagnosis present

## 2015-04-22 DIAGNOSIS — R5381 Other malaise: Secondary | ICD-10-CM | POA: Diagnosis present

## 2015-04-22 DIAGNOSIS — R0600 Dyspnea, unspecified: Secondary | ICD-10-CM | POA: Diagnosis present

## 2015-04-22 DIAGNOSIS — J9811 Atelectasis: Secondary | ICD-10-CM | POA: Diagnosis present

## 2015-04-22 DIAGNOSIS — I5031 Acute diastolic (congestive) heart failure: Secondary | ICD-10-CM | POA: Diagnosis present

## 2015-04-22 DIAGNOSIS — Z6822 Body mass index (BMI) 22.0-22.9, adult: Secondary | ICD-10-CM

## 2015-04-22 DIAGNOSIS — R32 Unspecified urinary incontinence: Secondary | ICD-10-CM | POA: Diagnosis present

## 2015-04-22 DIAGNOSIS — E440 Moderate protein-calorie malnutrition: Secondary | ICD-10-CM | POA: Diagnosis present

## 2015-04-22 DIAGNOSIS — Z9114 Patient's other noncompliance with medication regimen: Secondary | ICD-10-CM

## 2015-04-22 DIAGNOSIS — Z66 Do not resuscitate: Secondary | ICD-10-CM | POA: Diagnosis present

## 2015-04-22 DIAGNOSIS — I5033 Acute on chronic diastolic (congestive) heart failure: Secondary | ICD-10-CM | POA: Diagnosis not present

## 2015-04-22 DIAGNOSIS — I110 Hypertensive heart disease with heart failure: Principal | ICD-10-CM | POA: Diagnosis present

## 2015-04-22 DIAGNOSIS — I509 Heart failure, unspecified: Secondary | ICD-10-CM

## 2015-04-22 DIAGNOSIS — R739 Hyperglycemia, unspecified: Secondary | ICD-10-CM | POA: Diagnosis present

## 2015-04-22 DIAGNOSIS — Z79899 Other long term (current) drug therapy: Secondary | ICD-10-CM

## 2015-04-22 DIAGNOSIS — F0390 Unspecified dementia without behavioral disturbance: Secondary | ICD-10-CM | POA: Diagnosis present

## 2015-04-22 LAB — CBC
HCT: 44.5 % (ref 39.0–52.0)
Hemoglobin: 14.7 g/dL (ref 13.0–17.0)
MCH: 30.6 pg (ref 26.0–34.0)
MCHC: 33 g/dL (ref 30.0–36.0)
MCV: 92.7 fL (ref 78.0–100.0)
Platelets: 267 10*3/uL (ref 150–400)
RBC: 4.8 MIL/uL (ref 4.22–5.81)
RDW: 14.2 % (ref 11.5–15.5)
WBC: 6.5 10*3/uL (ref 4.0–10.5)

## 2015-04-22 LAB — COMPREHENSIVE METABOLIC PANEL
ALK PHOS: 93 U/L (ref 38–126)
ALT: 10 U/L — ABNORMAL LOW (ref 17–63)
AST: 22 U/L (ref 15–41)
Albumin: 3.8 g/dL (ref 3.5–5.0)
Anion gap: 7 (ref 5–15)
BUN: 19 mg/dL (ref 6–20)
CALCIUM: 9.1 mg/dL (ref 8.9–10.3)
CO2: 27 mmol/L (ref 22–32)
Chloride: 104 mmol/L (ref 101–111)
Creatinine, Ser: 0.86 mg/dL (ref 0.61–1.24)
Glucose, Bld: 105 mg/dL — ABNORMAL HIGH (ref 65–99)
Potassium: 4.3 mmol/L (ref 3.5–5.1)
Sodium: 138 mmol/L (ref 135–145)
TOTAL PROTEIN: 7.5 g/dL (ref 6.5–8.1)
Total Bilirubin: 0.8 mg/dL (ref 0.3–1.2)

## 2015-04-22 LAB — URINALYSIS, ROUTINE W REFLEX MICROSCOPIC
BILIRUBIN URINE: NEGATIVE
Glucose, UA: 100 mg/dL — AB
HGB URINE DIPSTICK: NEGATIVE
Ketones, ur: NEGATIVE mg/dL
Leukocytes, UA: NEGATIVE
Nitrite: NEGATIVE
Protein, ur: NEGATIVE mg/dL
SPECIFIC GRAVITY, URINE: 1.02 (ref 1.005–1.030)
pH: 5.5 (ref 5.0–8.0)

## 2015-04-22 LAB — I-STAT TROPONIN, ED: Troponin i, poc: 0.16 ng/mL (ref 0.00–0.08)

## 2015-04-22 LAB — CBG MONITORING, ED: GLUCOSE-CAPILLARY: 107 mg/dL — AB (ref 65–99)

## 2015-04-22 LAB — BRAIN NATRIURETIC PEPTIDE: B NATRIURETIC PEPTIDE 5: 126.9 pg/mL — AB (ref 0.0–100.0)

## 2015-04-22 MED ORDER — FUROSEMIDE 20 MG PO TABS
20.0000 mg | ORAL_TABLET | Freq: Every day | ORAL | Status: DC
Start: 2015-04-23 — End: 2015-04-23
  Filled 2015-04-22: qty 1

## 2015-04-22 MED ORDER — FUROSEMIDE 10 MG/ML IJ SOLN
20.0000 mg | Freq: Once | INTRAMUSCULAR | Status: AC
  Administered 2015-04-22: 20 mg via INTRAVENOUS
  Filled 2015-04-22: qty 2

## 2015-04-22 MED ORDER — SODIUM CHLORIDE 0.9 % IJ SOLN
3.0000 mL | Freq: Two times a day (BID) | INTRAMUSCULAR | Status: DC
  Administered 2015-04-22 – 2015-04-23 (×3): 3 mL via INTRAVENOUS

## 2015-04-22 MED ORDER — ACETAMINOPHEN 650 MG RE SUPP: 650.0000 mg | Freq: Four times a day (QID) | RECTAL | Status: DC | PRN

## 2015-04-22 MED ORDER — HYDRALAZINE HCL 20 MG/ML IJ SOLN
10.0000 mg | INTRAMUSCULAR | Status: DC | PRN
  Administered 2015-04-23: 10 mg via INTRAVENOUS
  Filled 2015-04-22: qty 1

## 2015-04-22 MED ORDER — ENOXAPARIN SODIUM 40 MG/0.4ML ~~LOC~~ SOLN
40.0000 mg | Freq: Every day | SUBCUTANEOUS | Status: DC
  Administered 2015-04-22 – 2015-04-23 (×2): 40 mg via SUBCUTANEOUS
  Filled 2015-04-22 (×3): qty 0.4

## 2015-04-22 MED ORDER — ACETAMINOPHEN 325 MG PO TABS: 650.0000 mg | ORAL_TABLET | Freq: Four times a day (QID) | ORAL | Status: DC | PRN

## 2015-04-22 MED ORDER — NITROGLYCERIN 2 % TD OINT
0.5000 [in_us] | TOPICAL_OINTMENT | Freq: Once | TRANSDERMAL | Status: AC
  Administered 2015-04-22: 0.5 [in_us] via TOPICAL
  Filled 2015-04-22: qty 30

## 2015-04-22 NOTE — H&P (Addendum)
[facility] History and Physical  OSHAE SIMMERING WUJ:[id number] DOB: 10-11-1918 DOA: 04/22/2015  Referring physician: ED PCP: Default, Provider, MD   Chief Complaint: Shortness of breath  HPI:  Patient is a 81 year old male with a past medical history significant for hypertension, dementia; who presents with complaints of shortness of breath and inability to care for himself. His wife who was present during the evaluation for proximal most patient's history due to his reported dementia. Patient had been at home and she had been caring for him. However, over the last year his ability to care for himself had significantly declined.  she notes that approximately one year ago he stopped taking all medications and had been doing just fine up until the last few months. During the summer he began to have significantly more falls. As time went on he became less mobile requiring assistance to bathe and use the restroom. In the last month he became almost totally dependent on her even for feeding himself meals. She notes a generalized weakness overall and he went to visit his primary care provider approximately one week ago and it was thought that he likely had some signs of congestive heart failure with elevated pro BNP of 1715 and lower extremity edema at that time. His wife return to the office today and she received a letter from the patient's physician which stated that the patient needed placement into a skilled nursing facility for care.  Upon admission into the [facility] emergency department patient was seen have O2 saturations at 83%. placed on nasal cannula oxygen of 2-3 L with improvement of O2 sats. Chest x-ray showed atelectasis versus fibrosis   Review of Systems  Constitutional: Negative for fever and chills.  HENT: Negative for tinnitus.   Eyes: Negative for photophobia and discharge.  Respiratory: Negative for cough and sputum production.   Cardiovascular: Negative for chest pain  and claudication.  Gastrointestinal: Negative for nausea, vomiting and abdominal pain.       Positive for Hiccups  Genitourinary: Negative for dysuria and urgency.  Musculoskeletal: Positive for joint pain and falls. Negative for back pain.  Skin: Negative for itching and rash.  Neurological: Negative for tremors, sensory change, focal weakness and headaches.  Endo/Heme/Allergies: Negative for environmental allergies and polydipsia.  Psychiatric/Behavioral: Negative for suicidal ideas and substance abuse.       No past medical history on file.   Past Surgical History  Procedure Laterality Date  . Tonsillectomy    . Appendectomy        Social History:  reports that he has never smoked. He has never used smokeless tobacco. He reports that he drinks about 1.2 - 1.8 oz of alcohol per week. He reports that he does not use illicit drugs. Where does patient live--home   and with whom if at home?Wife Can patient participate in ADLs? No  No Known Allergies  Family History  Problem Relation Age of Onset  . Stroke Sister        Prior to Admission medications   Medication Sig Start Date End Date Taking? Authorizing Provider  furosemide (LASIX) 20 MG tablet Take 1 tablet (20 mg total) by mouth daily. As directed by physician 04/11/15  Yes Sherren Mocha, MD     Physical Exam: Filed Vitals:   04/22/15 1754 04/22/15 1924 04/22/15 2024 04/22/15 2138  BP: 182/111 191/106 167/91 168/100  Pulse: 79 82 72 83  Temp: 97.6 F (36.4 C)   97.5 F (36.4 C)  TempSrc: Oral  Oral  Resp: SpO2: 100% 100% 96% 100%     Constitutional: Vital signs reviewed. Patient is an elderly male who appears to be in no acute distress seen hiccuping multiple times during examination. Alert and oriented x3.  Head: Normocephalic and atraumatic  Ear: TM normal bilaterally, decreased gross hearing Mouth: no erythema or exudates, MMM  Eyes: PERRL, EOMI, conjunctivae normal, No scleral icterus.   Neck: Supple, Trachea midline normal ROM, No JVD, mass, thyromegaly, or carotid bruit present.  Cardiovascular: RRR, S1 normal, S2 normal, no MRG, pulses symmetric and intact bilaterally  Pulmonary/Chest: Decreased overall air movement positive crackles in the mid to lower lung bases bilaterally. No significant wheezes rales or rhonchi. Abdominal: Soft. Non-tender, non-distended, bowel sounds are normal, no masses, organomegaly, or guarding present.  GU: no CVA tenderness Musculoskeletal: No joint deformities, erythema, or stiffness, ROM full and no nontender Ext: +1 pitting edema on the right more so than the left.  no cyanosis, pulses palpable bilaterally (DP and PT)  Hematology: no cervical, inginal, or axillary adenopathy.  Neurological: A&O x3, Strenght is normal and symmetric bilaterally, cranial nerve II-XII are grossly intact, no focal motor deficit, sensory intact to light touch bilaterally.  Skin: Warm, dry and intact. No rash, cyanosis, or clubbing.  Psychiatric: Normal mood and affect. speech and behavior is normal. Judgment and thought content normal. Cognition and memory are normal.      Data Review   Micro Results No results found for this or any previous visit (from the past 240 hour(s)).  Radiology Reports Dg Chest 2 View  04/22/2015  CLINICAL DATA:  Mid chest pain.  Confusion.  Low saturation. EXAM: CHEST  2 VIEW COMPARISON:  CT chest 10/05/2010.  Chest 10/05/2010. FINDINGS: Postoperative changes in the mediastinum. Normal heart size and pulmonary vascularity. Calcification and tortuosity of the aorta. Mediastinal contours appear intact. Linear scarring or atelectasis in the lung bases. No focal airspace disease in the lungs. No blunting of costophrenic angles. No pneumothorax. Degenerative changes in the spine. IMPRESSION: Linear atelectasis or fibrosis in the lung bases. No focal consolidation. Electronically Signed   By: Burman Nieves M.D.   On: 04/22/2015 19:27      CBC  Recent Labs Lab 04/22/15 1822  WBC 6.5  HGB 14.7  HCT 44.5  PLT 267  MCV 92.7  MCH 30.6  MCHC 33.0  RDW 14.2    Chemistries   Recent Labs Lab 04/22/15 1822  NA 138  K 4.3  CL 104  CO2 27  GLUCOSE 105*  BUN 19  CREATININE 0.86  CALCIUM 9.1  AST 22  ALT 10*  ALKPHOS 93  BILITOT 0.8   ------------------------------------------------------------------------------------------------------------------ CrCl cannot be calculated (Unknown ideal weight.). ------------------------------------------------------------------------------------------------------------------ No results for input(s): HGBA1C in the last 72 hours. ------------------------------------------------------------------------------------------------------------------ No results for input(s): CHOL, HDL, LDLCALC, TRIG, CHOLHDL, LDLDIRECT in the last 72 hours. ------------------------------------------------------------------------------------------------------------------ No results for input(s): TSH, T4TOTAL, T3FREE, THYROIDAB in the last 72 hours.  Invalid input(s): FREET3 ------------------------------------------------------------------------------------------------------------------ No results for input(s): VITAMINB12, FOLATE, FERRITIN, TIBC, IRON, RETICCTPCT in the last 72 hours.  Coagulation profile No results for input(s): INR, PROTIME in the last 168 hours.  No results for input(s): DDIMER in the last 72 hours.  Cardiac Enzymes No results for input(s): CKMB, TROPONINI, MYOGLOBIN in the last 168 hours.  Invalid input(s): CK ------------------------------------------------------------------------------------------------------------------ Invalid input(s): POCBNP   CBG:  Recent Labs Lab 04/22/15 1826  GLUCAP 107*     EKG: Independently reviewed.Showed sinus rhythm with  a possible conduction delay QTC was 484    Assessment/Plan Principal Problem:  Suspected CHF (congestive  heart failure) (HCC) Acute. Patient with a pro BNP on 04/11/2015 of 1715. Repeat today BNP of 125 . patient physical exam shows mild lower extremity swelling and intermittent crackles no significant pulmonary edema seen on chest x-ray.  -provided patient with a 1 time dose of 20 mg of IV Lasix -Continue home dose Lasix of 20 mg daily daily -echocardiogram in a.m.  -BMP in a.m.    Dyspnea: Acute patient found to have O2 saturations 83% on room air. Placed to nasal cannula with improvement of O2 sats to greater than 92%. Physical exam revealed bilateral crackles. -Continuous pulse oximetry -Nasal cannula oxygen as needed to keep O2 sats greater than 92%  -Incentive spirometry every 2 hours while awake     Benign essential HTN: Uncontrolled. Blood pressure as high as 190/106. Patient given nitroglycerin ointment while in ED with mild improvement in BP.Patient denies chest pain. -Hydralazine as needed prn for sbp >180.  and ED  -Consider adding on ACE-I in am  Debility: Patient reported to be unable to feed or care for himself without assistance. Previous history of falls. -PT/ OT eval and treat  -Social work consult for placement    Dementia: Stable. Family notes patient able to recognize faces and past events, but not recent events.    Code Status:   full Family Communication: bedside Disposition Plan: admit   Total time spent 55 minutes.Greater than 50% of this time was spent in counseling, explanation of diagnosis, planning of further management, and coordination of care  Clydie BraunRondell A Melbert Botelho [facility] Pager [phone]  If 7PM-7AM, please contact night-coverage [email or web address] Password [email or web address] 04/22/2015, 9:40 PM

## 2015-04-22 NOTE — ED Notes (Signed)
Patient transported to X-ray 

## 2015-04-22 NOTE — ED Notes (Signed)
I gave I Stat cTnl results to MD Rubin PayorPickering

## 2015-04-22 NOTE — ED Notes (Signed)
MD at bedside. 

## 2015-04-22 NOTE — ED Provider Notes (Signed)
CSN: [id number]     Arrival date & time 04/22/15  1740 History   First MD Initiated Contact with Patient 04/22/15 1822     Chief Complaint  Patient presents with  . Dementia  . Shortness of Breath      The history is provided by the patient and the EMS personnel. No language interpreter was used.   Jason York is a 81 y.o. male who presents to the Emergency Department complaining of AMS.  Level V  caveat due to dementia.  Hx is provided by EMS and the patient. Per EMS report he had a change in his mental status over the last week, he is more confused and unsteady. He has new urinary incontinence. Per patient he is here for a checkup. He reports shortness of breath, no other symptoms. Symptoms are moderate, waxing and waning, worsening.   No past medical history on file. Past Surgical History  Procedure Laterality Date  . Tonsillectomy    . Appendectomy     Family History  Problem Relation Age of Onset  . Stroke Sister    Social History  Substance Use Topics  . Smoking status: Never Smoker   . Smokeless tobacco: Never Used  . Alcohol Use: 1.2 - 1.8 oz/week    2-3 Standard drinks or equivalent per week     Comment: occassional    Review of Systems  All other systems reviewed and are negative.     Allergies  Review of patient's allergies indicates no known allergies.  Home Medications   Prior to Admission medications   Medication Sig Start Date End Date Taking? Authorizing Provider  furosemide (LASIX) 20 MG tablet Take 1 tablet (20 mg total) by mouth daily. As directed by physician 04/11/15  Yes Sherren MochaEva N Shaw, MD   BP 182/111 mmHg  Pulse 79  Temp(Src) 97.6 F (36.4 C) (Oral)  Resp 21  SpO2 100% Physical Exam  Constitutional: He appears well-developed and well-nourished.  Hiccups frequently  HENT:  Head: Normocephalic and atraumatic.  Cardiovascular: Normal rate and regular rhythm.   No murmur heard. Pulmonary/Chest: Effort normal. No respiratory distress.   Decreased air movement bilateral bases  Abdominal: Soft. There is no tenderness. There is no rebound and no guarding.  Musculoskeletal: He exhibits no tenderness.  1+ pitting edema bilateral lower extremities  Neurological: He is alert.  Disoriented to time, generalized weakness, mildly confused  Skin: Skin is warm and dry.  Psychiatric: He has a normal mood and affect. His behavior is normal.  Nursing note and vitals reviewed.   ED Course  Procedures (including critical care time) Labs Review Labs Reviewed  URINALYSIS, ROUTINE W REFLEX MICROSCOPIC (NOT AT [facility]) - Abnormal; Notable for the following:    Color, Urine AMBER (*)    Glucose, UA 100 (*)    All other components within normal limits  COMPREHENSIVE METABOLIC PANEL - Abnormal; Notable for the following:    Glucose, Bld 105 (*)    ALT 10 (*)    All other components within normal limits  BRAIN NATRIURETIC PEPTIDE - Abnormal; Notable for the following:    B Natriuretic Peptide 126.9 (*)    All other components within normal limits  CBG MONITORING, ED - Abnormal; Notable for the following:    Glucose-Capillary 107 (*)    All other components within normal limits  I-STAT TROPOININ, ED - Abnormal; Notable for the following:    Troponin i, poc 0.16 (*)    All other components within normal  limits  CBC  BASIC METABOLIC PANEL  TSH    Imaging Review No results found. I have personally reviewed and evaluated these images and lab results as part of my medical decision-making.   EKG Interpretation   Date/Time:  Monday April 22 2015 18:26:37 EST Ventricular Rate:  77 PR Interval:  191 QRS Duration: 119 QT Interval:  428 QTC Calculation: 484 R Axis:   97 Text Interpretation:  Sinus rhythm Nonspecific intraventricular conduction  delay Anteroseptal infarct, age indeterminate Confirmed by Lincoln Brigham  [phone]) on 04/22/2015 6:29:54 PM      MDM   Final diagnoses:  Dyspnea    Patient with history of dementia here  with increased shortness of breath, increased difficulty with ADLs at home. Patient is chronically ill-appearing and debilitated. Troponin is elevated, EKG similar to priors, patient denies any chest pain. BNP is also elevated and examination is concerning for CHF. He is noted to be hypertensive, providing nitroglycerin for blood pressure control. His wife reports that he has been noncompliant with his medications for the last year and he would not want any aggressive treatments. Discussed with hospitalist regarding admission for options such as follows care or hospice or skilled nursing facility.    Tilden Fossa, MD 04/23/15 [id number]

## 2015-04-22 NOTE — ED Notes (Addendum)
According to patient's wife his mental status has changed over the last week. He has been more confused and unsteady. Patient has a very strong smell of urine and new urinary incontinence. He does not have any complaints.

## 2015-04-22 NOTE — ED Notes (Signed)
Bed: RESA Expected date:  Expected time:  Means of arrival:  Comments: Dementia, poss UTI

## 2015-04-22 NOTE — ED Notes (Signed)
REPORT GIVEN TO HUI, RN [id number]. AAOX3. PT IN NO APPARENT DISTRESS OR PAIN. FAMILY AT THE BEDSIDE. THE OPPORTUNITY TO ASK QUESTIONS WAS PROVIDED. WILL TRANSPORT PT TO THE FLOOR.

## 2015-04-23 ENCOUNTER — Inpatient Hospital Stay (HOSPITAL_COMMUNITY): Payer: Medicare Other

## 2015-04-23 DIAGNOSIS — E440 Moderate protein-calorie malnutrition: Secondary | ICD-10-CM

## 2015-04-23 DIAGNOSIS — F0391 Unspecified dementia with behavioral disturbance: Secondary | ICD-10-CM

## 2015-04-23 DIAGNOSIS — I509 Heart failure, unspecified: Secondary | ICD-10-CM

## 2015-04-23 LAB — BASIC METABOLIC PANEL
Anion gap: 10 (ref 5–15)
BUN: 16 mg/dL (ref 6–20)
CHLORIDE: 102 mmol/L (ref 101–111)
CO2: 27 mmol/L (ref 22–32)
CREATININE: 0.75 mg/dL (ref 0.61–1.24)
Calcium: 8.8 mg/dL — ABNORMAL LOW (ref 8.9–10.3)
GFR calc Af Amer: >60 mL/min (ref >60)
GFR calc non Af Amer: >60 mL/min (ref >60)
GLUCOSE: 127 mg/dL — AB (ref 65–99)
POTASSIUM: 3.6 mmol/L (ref 3.5–5.1)
Sodium: 139 mmol/L (ref 135–145)

## 2015-04-23 LAB — VITAMIN B12: Vitamin B-12: 360 pg/mL (ref 180–914)

## 2015-04-23 LAB — TSH
TSH: 2.033 u[IU]/mL (ref 0.350–4.500)
TSH: 2.726 u[IU]/mL (ref 0.350–4.500)

## 2015-04-23 MED ORDER — ENSURE ENLIVE PO LIQD
237.0000 mL | Freq: Two times a day (BID) | ORAL | Status: DC
  Administered 2015-04-23 – 2015-04-24 (×3): 237 mL via ORAL

## 2015-04-23 MED ORDER — FUROSEMIDE 10 MG/ML IJ SOLN
20.0000 mg | Freq: Once | INTRAMUSCULAR | Status: AC
  Administered 2015-04-23: 20 mg via INTRAVENOUS
  Filled 2015-04-23: qty 2

## 2015-04-23 MED ORDER — AMLODIPINE BESYLATE 10 MG PO TABS
10.0000 mg | ORAL_TABLET | Freq: Every day | ORAL | Status: DC
  Administered 2015-04-23 – 2015-04-24 (×2): 10 mg via ORAL
  Filled 2015-04-23 (×2): qty 1

## 2015-04-23 MED ORDER — POTASSIUM CHLORIDE CRYS ER 20 MEQ PO TBCR
40.0000 meq | EXTENDED_RELEASE_TABLET | Freq: Once | ORAL | Status: AC
  Administered 2015-04-23: 40 meq via ORAL
  Filled 2015-04-23: qty 2

## 2015-04-23 MED ORDER — POLYETHYLENE GLYCOL 3350 17 G PO PACK
17.0000 g | PACK | Freq: Every day | ORAL | Status: DC
  Administered 2015-04-23: 17 g via ORAL
  Filled 2015-04-23 (×2): qty 1

## 2015-04-23 NOTE — Care Management Note (Signed)
Case Management Note  Patient Details  Name: Marlynn PerkingHarold E Bodkins MRN: [id number] Date of Birth: 06/14/1918  Subjective/Objective:        hypoxia            Action/Plan:Date: April 23, 2015 Chart reviewed for concurrent status and case management needs. Will continue to follow patient for changes and needs: Marcelle Smilinghonda Davis, RN, BSN, [place]   [phone]   Expected Discharge Date:   (unknown)               Expected Discharge Plan:  Home/Self Care  In-House Referral:  Clinical Social Work  Discharge planning Services  CM Consult  Post Acute Care Choice:    Choice offered to:     DME Arranged:    DME Agency:     HH Arranged:    HH Agency:     Status of Service:  In process, will continue to follow  Medicare Important Message Given:    Date Medicare IM Given:    Medicare IM give by:    Date Additional Medicare IM Given:    Additional Medicare Important Message give by:     If discussed at Long Length of Stay Meetings, dates discussed:    Additional Comments:  Golda AcreDavis, Rhonda Lynn, RN 04/23/2015, 10:51 AM

## 2015-04-23 NOTE — Evaluation (Signed)
Physical Therapy Evaluation Patient Details Name: Jason York MRN: [id number] DOB: 01/18/1919 Today's Date: 04/23/2015   History of Present Illness  81 yo male admitted with CHF and atelectasis, PMH significant for hypertension, and dementia that got progressively worse over the last year to the point that pt is not able to take care of himself and has had multiple falls  Clinical Impression  Pt admitted with above diagnosis. Pt currently with functional limitations due to the deficits listed below (see PT Problem List). Pt will benefit from skilled PT to increase their independence and safety with mobility to allow discharge to the venue listed below. Pt requires max assist for bed mobility and transfers. Recommend SNF for continuous rehab.        Follow Up Recommendations SNF;Supervision for mobility/OOB    Equipment Recommendations  None recommended by PT    Recommendations for Other Services       Precautions / Restrictions Precautions Precautions: Fall Restrictions Weight Bearing Restrictions: No      Mobility  Bed Mobility Overal bed mobility: Needs Assistance;+2 for physical assistance Bed Mobility: Supine to Sit     Supine to sit: Max assist;+2 for physical assistance     General bed mobility comments: max assist for scooting and bringing both LEs down, multiple multi-modal cues for using UEs to push off, assist with trunk elevation, increased time   Transfers Overall transfer level: Needs assistance Equipment used: Rolling walker (2 wheeled) Transfers: Sit to/from [employer] CorporationStand;Stand Pivot Transfers Sit to Stand: Max assist;+2 physical assistance Stand pivot transfers: Max assist;+2 physical assistance       General transfer comment: max assist initially in standing d/t pt's posterior lean, progressed to mod assist; max cues for stepping, turning, and using UEs to reach back during descend  Ambulation/Gait             General Gait Details: Postponed  ambulation d/t low level / inability to maintain balance in standing  Stairs            Wheelchair Mobility    Modified Rankin (Stroke Patients Only)       Balance Overall balance assessment: Needs assistance Sitting-balance support: Bilateral upper extremity supported Sitting balance-Leahy Scale: Poor   Postural control: Posterior lean Standing balance support: Bilateral upper extremity supported Standing balance-Leahy Scale: Zero Standing balance comment: Pt requires max assist to maintain balance                              Pertinent Vitals/Pain Pain Assessment: No/denies pain    Home Living Family/patient expects to be discharged to:: Private residence Living Arrangements: Spouse/significant other                    Prior Function Level of Independence: Needs assistance         Comments: Unable to take accurate history d/t pt's confusion, his wife is not at bedside to help detrmine baseline     Hand Dominance        Extremity/Trunk Assessment   Upper Extremity Assessment: Generalized weakness           Lower Extremity Assessment: Generalized weakness      Cervical / Trunk Assessment: Normal  Communication   Communication: Other (comment) (confusion)  Cognition Arousal/Alertness: Awake/alert Behavior During Therapy: WFL for tasks assessed/performed Overall Cognitive Status: No family/caregiver present to determine baseline cognitive functioning Area of Impairment: Following commands  Following Commands: Follows one step commands inconsistently       General Comments: Pt was not oriented to time, states that the current year is around 1992, but does remember his wife as his caregiver      General Comments      Exercises        Assessment/Plan    PT Assessment Patient needs continued PT services  PT Diagnosis Difficulty walking;Generalized weakness   PT Problem List Decreased strength;Decreased  activity tolerance;Decreased balance;Decreased mobility;Decreased safety awareness;Decreased knowledge of use of DME;Decreased knowledge of precautions  PT Treatment Interventions DME instruction;Gait training;Functional mobility training;Therapeutic activities;Therapeutic exercise;Balance training;Patient/family education   PT Goals (Current goals can be found in the Care Plan section) Acute Rehab PT Goals Patient Stated Goal: none stated  PT Goal Formulation: With patient Time For Goal Achievement: 05/21/2015 Potential to Achieve Goals: Good    Frequency Min 3X/week   Barriers to discharge        Co-evaluation               End of Session Equipment Utilized During Treatment: Gait belt Activity Tolerance: Patient tolerated treatment well Patient left: in chair;with call bell/phone within reach;with chair alarm set Nurse Communication: Mobility status         Time: 4782-9562 PT Time Calculation (min) (ACUTE ONLY): 19 min   Charges:   PT Evaluation $Initial PT Evaluation Tier I: 1 Procedure     PT G Codes:        [name], SPT 06/01/2015, 1:02 PM

## 2015-04-23 NOTE — Progress Notes (Addendum)
[facility] PROGRESS NOTE  Jason York ZOX:[id number] DOB: 04/03/1919 DOA: 04/22/2015 PCP: Default, Provider, MD   Chief Complaint: Shortness of breath  HPI:  Patient is a 81 year old male with a past medical history significant for hypertension, dementia; who presents with complaints of shortness of breath and inability to care for himself. His wife who was present during the evaluation for proximal most patient's history due to his reported dementia. Patient had been at home and she had been caring for him. However, over the last year his ability to care for himself had significantly declined. she notes that approximately one year ago he stopped taking all medications and had been doing just fine up until the last few months. During the summer he began to have significantly more falls. As time went on he became less mobile requiring assistance to bathe and use the restroom. In the last month he became almost totally dependent on her even for feeding himself meals. She notes a generalized weakness overall and he went to visit his primary care provider approximately one week ago and it was thought that he likely had some signs of congestive heart failure with elevated pro BNP of 1715 and lower extremity edema at that time. His wife return to the office today and she received a letter from the patient's physician which stated that the patient needed placement into a skilled nursing facility for care.  Upon admission into the [facility] emergency department patient was seen have O2 saturations at 83%. placed on nasal cannula oxygen of 2-3 L with improvement of O2 sats. Chest x-ray showed atelectasis versus fibrosis  Assessment/Plan: 1. Acute CHF/Likely diastolic with mild hypoxia -mild, not very impressive, but does have edema  -Lasix 20 mg IV today -Monitor I/O -follow-up 2-D echocardiogram -Wean O2   2. Uncontrolled hypertension -Improving with diuresis -I will add amlodipine,  continue hydralazine when necessary  3. Dementia -Will check TSH, B-12, RPR  4. Hyperglycemia -No history of DM, check HbA1c  5. Debility/failure to thrive -Dietitian consulted, PT OT eval -Will likely need rehabilitation -wife reports decline for 1year, worse in 1 week  Code Status: DNR, after discussion with Wife Family Communication: None at bedside, called and d/w wife Disposition Plan: will likely need rehab    HPI/Subjective: Feels, ok, tells me that he has leg swelling, breathing is ok  Objective: Filed Vitals:   04/22/15 2224 04/23/15 0500  BP: 178/86 162/110  Pulse: 66 70  Temp: 98.1 F (36.7 C) 98.1 F (36.7 C)  Resp: 17 20    Intake/Output Summary (Last 24 hours) at 04/23/15 1043 Last data filed at 04/23/15 0558  Gross per 24 hour  Intake      0 ml  Output    400 ml  Net   -400 ml   There were no vitals filed for this visit.  Exam:   General: alert, awake, oriented to self, place, cachectic  Cardiovascular: S1S2/RRR  Respiratory: CTAB  Abdomen: soft, NT, BS present  Musculoskeletal:1 plus edema   Data Reviewed: Basic Metabolic Panel:  Recent Labs Lab 04/22/15 1822 04/23/15 0520  NA 138 139  K 4.3 3.6  CL 104 102  CO2 27 27  GLUCOSE 105* 127*  BUN 19 16  CREATININE 0.86 0.75  CALCIUM 9.1 8.8*   Liver Function Tests:  Recent Labs Lab 04/22/15 1822  AST 22  ALT 10*  ALKPHOS 93  BILITOT 0.8  PROT 7.5  ALBUMIN 3.8   No results for input(s): LIPASE,  AMYLASE in the last 168 hours. No results for input(s): AMMONIA in the last 168 hours. CBC:  Recent Labs Lab 04/22/15 1822  WBC 6.5  HGB 14.7  HCT 44.5  MCV 92.7  PLT 267   Cardiac Enzymes: No results for input(s): CKTOTAL, CKMB, CKMBINDEX, TROPONINI in the last 168 hours. BNP (last 3 results)  Recent Labs  04/22/15 1822  BNP 126.9*    ProBNP (last 3 results)  Recent Labs  04/11/15 1745  PROBNP 1715.00*    CBG:  Recent Labs Lab 04/22/15 1826  GLUCAP  107*    No results found for this or any previous visit (from the past 240 hour(s)).   Studies: Dg Chest 2 View  04/22/2015  CLINICAL DATA:  Mid chest pain.  Confusion.  Low saturation. EXAM: CHEST  2 VIEW COMPARISON:  CT chest 10/05/2010.  Chest 10/05/2010. FINDINGS: Postoperative changes in the mediastinum. Normal heart size and pulmonary vascularity. Calcification and tortuosity of the aorta. Mediastinal contours appear intact. Linear scarring or atelectasis in the lung bases. No focal airspace disease in the lungs. No blunting of costophrenic angles. No pneumothorax. Degenerative changes in the spine. IMPRESSION: Linear atelectasis or fibrosis in the lung bases. No focal consolidation. Electronically Signed   By: Burman NievesWilliam  Stevens M.D.   On: 04/22/2015 19:27    Scheduled Meds: . enoxaparin (LOVENOX) injection  40 mg Subcutaneous QHS  . feeding supplement (ENSURE ENLIVE)  237 mL Oral BID BM  . furosemide  20 mg Intravenous Once  . potassium chloride  40 mEq Oral Once  . sodium chloride  3 mL Intravenous Q12H   Continuous Infusions:  Antibiotics Given (last 72 hours)    None      Principal Problem:   CHF (congestive heart failure) (HCC) Active Problems:   Dyspnea   Debility   Benign essential HTN   Dementia    Time spent: 35min   Oviedo Medical CenterJOSEPH,Ansar Skoda  [facility] Pager [phone]. If 7PM-7AM, please contact night-coverage at [email or web address], password [email or web address] 04/23/2015, 10:43 AM  LOS: 1 day

## 2015-04-23 NOTE — Progress Notes (Signed)
Initial Nutrition Assessment  DOCUMENTATION CODES:   Non-severe (moderate) malnutrition in context of chronic illness  INTERVENTION:  - Will order Ensure Enlive po BID, each supplement provides 350 kcal and 20 grams of protein - RD will continue to monitor for needs  NUTRITION DIAGNOSIS:   Malnutrition related to chronic illness as evidenced by moderate depletion of body fat, moderate depletions of muscle mass.  GOAL:   Patient will meet greater than or equal to 90% of their needs  MONITOR:   PO intake, Supplement acceptance, Weight trends, Labs, Skin, I & O's  REASON FOR ASSESSMENT:   Malnutrition Screening Tool  ASSESSMENT:   81 year old male with a past medical history significant for hypertension, dementia; who presents with complaints of shortness of breath and inability to care for himself. His wife who was present during the evaluation for proximal most patient's history due to his reported dementia. Patient had been at home and she had been caring for him. However, over the last year his ability to care for himself had significantly declined. she notes that approximately one year ago he stopped taking all medications and had been doing just fine up until the last few months. During the summer he began to have significantly more falls. As time went on he became less mobile requiring assistance to bathe and use the restroom. In the last month he became almost totally dependent on her even for feeding himself meals. She notes a generalized weakness overall and he went to visit his primary care provider approximately one week ago and it was thought that he likely had some signs of congestive heart failure with elevated pro BNP of 1715 and lower extremity edema at that time. His wife return to the office today and she received a letter from the patient's physician which stated that the patient needed placement into a skilled nursing facility for care.   Pt seen for MST. BMI unable  to be calculated related to no height on file. Pt with hx of dementia and no family/visitors present at time of RD visit. Pt states he did not have anything for breakfast this AM. He states that he usually has a good appetite and indicates that he is feeling hungry this AM. Good dentition noted and pt denies chewing issues.   No previous weight hx available in the chart. Severe and moderate muscle wasting to upper body and moderate fat wasting to upper body.  Will continue to monitor for GOC. Medications reviewed. Labs reviewed.    Diet Order:  Diet Heart Room service appropriate?: Yes; Fluid consistency:: Thin  Skin:  Reviewed, no issues  Last BM:  11/19  Height:   Ht Readings from Last 1 Encounters:  No data found for Ht    Weight:   Wt Readings from Last 1 Encounters:  04/11/15 123 lb (55.792 kg)    Ideal Body Weight:   unable to calculate with no recorded ht  BMI:  There is no height or weight on file to calculate BMI.  Estimated Nutritional Needs:   Kcal:  [id number]  Protein:  50-60 grams  Fluid:  1.8-2 L/day  EDUCATION NEEDS:   No education needs identified at this time     Trenton GammonJessica Carlisia Geno, RD, LDN Inpatient Clinical Dietitian Pager # [phone] After hours/weekend pager # [phone]

## 2015-04-23 NOTE — Progress Notes (Signed)
OT Cancellation Note  Patient Details Name: Jason PerkingHarold E Emrich MRN: [id number] DOB: 12/22/1918   Cancelled Treatment:    Reason Eval/Treat Not Completed: Patient at procedure or test/ unavailable  Will check on pt later in day or next day   Nicanor AlconREDDING, Lolamae Voisin D  Lori Mishael Krysiak, [place]OT [phone] 04/23/2015, 11:18 AM

## 2015-04-24 DIAGNOSIS — I5033 Acute on chronic diastolic (congestive) heart failure: Secondary | ICD-10-CM

## 2015-04-24 LAB — HEMOGLOBIN A1C
Hgb A1c MFr Bld: 5.9 % — ABNORMAL HIGH (ref 4.8–5.6)
MEAN PLASMA GLUCOSE: 123 mg/dL

## 2015-04-24 LAB — BASIC METABOLIC PANEL
Anion gap: 10 (ref 5–15)
BUN: 24 mg/dL — AB (ref 6–20)
CALCIUM: 9 mg/dL (ref 8.9–10.3)
CO2: 27 mmol/L (ref 22–32)
CREATININE: 0.85 mg/dL (ref 0.61–1.24)
Chloride: 103 mmol/L (ref 101–111)
GFR calc Af Amer: >60 mL/min (ref >60)
Glucose, Bld: 103 mg/dL — ABNORMAL HIGH (ref 65–99)
POTASSIUM: 3.7 mmol/L (ref 3.5–5.1)
SODIUM: 140 mmol/L (ref 135–145)

## 2015-04-24 LAB — RPR: RPR: NONREACTIVE

## 2015-04-24 MED ORDER — ENSURE ENLIVE PO LIQD: 237.0000 mL | Freq: Two times a day (BID) | ORAL | Status: AC

## 2015-04-24 NOTE — Clinical Social Work Placement (Signed)
   CLINICAL SOCIAL WORK PLACEMENT  NOTE  Date:  04/24/2015  Patient Details  Name: Jason York MRN: [id number] Date of Birth: 03/23/1919  Clinical Social Work is seeking post-discharge placement for this patient at the Skilled  Nursing Facility level of care (*CSW will initial, date and re-position this form in  chart as items are completed):  Yes   Patient/family provided with [facility] Clinical Social Work Department's list of facilities offering this level of care within the geographic area requested by the patient (or if unable, by the patient's family).  Yes   Patient/family informed of their freedom to choose among providers that offer the needed level of care, that participate in Medicare, Medicaid or managed care program needed by the patient, have an available bed and are willing to accept the patient.  Yes   Patient/family informed of [facility]'s ownership interest in [facility] and [facility], as well as of the fact that they are under no obligation to receive care at these facilities.  PASRR submitted to EDS on 04/24/15     PASRR number received on 04/24/15     Existing PASRR number confirmed on       FL2 transmitted to all facilities in geographic area requested by pt/family on 04/24/15     FL2 transmitted to all facilities within larger geographic area on       Patient informed that his/her managed care company has contracts with or will negotiate with certain facilities, including the following:            Patient/family informed of bed offers received.  Patient chooses bed at       Physician recommends and patient chooses bed at      Patient to be transferred to   on 04/24/15.  Patient to be transferred to facility by [facility]     Patient family notified on 04/24/15 of transfer.  Name of family member notified:  WIfe     PHYSICIAN Please prepare priority discharge summary, including medications     Additional Comment:     _______________________________________________ Rondel BatonIngle, Gurbani Figge C, LCSW 04/24/2015, 10:07 AM

## 2015-04-24 NOTE — Clinical Social Work Note (Signed)
Clinical Social Work Assessment  Patient Details  Name: Jason PerkingHarold E Toren MRN: [id number] Date of Birth: 07/24/1918  Date of referral:  04/24/15               Reason for consult:  Facility Placement                Permission sought to share information with:  Facility Medical sales representativeContact Representative, Family Supports Permission granted to share information::  No (patient disoriented)  Name::        Agency::   ([facility])  Relationship::     Contact Information:     Housing/Transportation Living arrangements for the past 2 months:  Single Family Home Source of Information:  Spouse Patient Interpreter Needed:  None Criminal Activity/Legal Involvement Pertinent to Current Situation/Hospitalization:  No - Comment as needed Significant Relationships:    Lives with:  Significant Other Do you feel safe going back to the place where you live?  No Need for family participation in patient care:  Yes (Comment) (patient is disoriented)  Care giving concerns:  Wife states she can no longer care for her husband at home.   Social Worker assessment / plan:  CSW spoke with patient's wife regarding possible SNF placement.  Wife states she is no longer able to care for husband at home.  Wife states the patient is unsteady and often falls and wife cannot physically care for patient.  Wife is requesting SNF placement at time of discharge.  At this time, patient has three [facility] offers[facility], [place] [facility].  Wife has no working knowledge of any SNF.  Wife is requesting to tour these facilities prior to discharge.  Discharge is prepared for today.  Wife states [facility] is the closest to her home and that is her first choice.  Wife states she will [place] and contact this CSW as soon as she has completed the tour.    Employment status:  Retired Database administratornsurance information:  Managed Medicare PT Recommendations:  Skilled Nursing Facility Information /  Referral to community resources:  Skilled Nursing Facility  Patient/Family's Response to care:  Wife is agreeable to SNF placement.  Patient/Family's Understanding of and Emotional Response to Diagnosis, Current Treatment, and Prognosis: Wife is very realistic regarding level of care and therapies needed at time of discharge though heartbroken that she can no longer care for the patient in the home.  Emotional Assessment Appearance:  Appears stated age Attitude/Demeanor/Rapport:   (appropriate) Affect (typically observed):  Accepting, Adaptable Orientation:  Oriented to Self Alcohol / Substance use:  Not Applicable Psych involvement (Current and /or in the community):  No (Comment)  Discharge Needs  Concerns to be addressed:  No discharge needs identified Readmission within the last 30 days:  No Current discharge risk:  None Barriers to Discharge:  No Barriers Identified   Rondel Batonngle, Daved Mcfann C, LCSW 04/24/2015, 10:02 AM

## 2015-04-24 NOTE — Discharge Summary (Signed)
Physician Discharge Summary  Jason York ZOX:[id number] DOB: 03/20/1919 DOA: 04/22/2015  PCP: Default, Provider, [place]  Admit date: 04/22/2015 Discharge date: 04/24/2015  Time spent: > 35 minutes  Recommendations for Outpatient Follow-up:   Monitor blood pressures place on antihypertensive regimen should patient require it.  Pt being discharged on lasix Continue physical therapy while at facility  Discharge Diagnoses:  Principal Problem:   CHF (congestive heart failure) (HCC) Active Problems:   Dyspnea   Debility   Benign essential HTN   Dementia   Malnutrition of moderate degree   Discharge Condition: stable  Diet recommendation: heart healthy  There were no vitals filed for this visit.  History of present illness:  From original HPI: 81 year old male with a past medical history significant for hypertension, dementia; who presents with complaints of shortness of breath and inability to care for himself.  Hospital Course:  SOB - Most likely due to diastolic CHF. Resolved with Lasix administration - will d/c on lasix  HTN - last blood pressures have been low normal. Given his age patient may have higher blood pressures due to calcified arteries as such will not be so aggressive on d/c and discontinue amlodipine. May be continued if blood pressures persistently elevated above 140/90 at SNF.  Debility - Patient to continue physical therapy while at SNF  Procedures:  Echocardiogram: EF of 60-65% with grade 1 DD  Consultations:  None  Discharge Exam: Filed Vitals:   04/23/15 1450 04/23/15 2235  BP: 100/54 129/81  Pulse: 86 69  Temp: 97.7 F (36.5 C) 97.8 F (36.6 C)  Resp: 20 20    General: Patient in no acute distress, alert and awake Cardiovascular: Regular rate and rhythm, no rubs Respiratory: No increased work of breathing, no wheezes equal chest rise  Discharge Instructions   Discharge Instructions    Call MD for:  difficulty breathing, headache  or visual disturbances    Complete by:  As directed      Call MD for:  redness, tenderness, or signs of infection (pain, swelling, redness, odor or green/yellow discharge around incision site)    Complete by:  As directed      Call MD for:  temperature >100.4    Complete by:  As directed      Diet - low sodium heart healthy    Complete by:  As directed      Discharge instructions    Complete by:  As directed   Please continue to monitor your blood pressures. Also follow up with your primary care physician in 1-2 weeks or sooner.     Increase activity slowly    Complete by:  As directed           Current Discharge Medication List    START taking these medications   Details  feeding supplement, ENSURE ENLIVE, (ENSURE ENLIVE) LIQD Take 237 mLs by mouth 2 (two) times daily between meals. Qty: 237 mL, Refills: 12      CONTINUE these medications which have NOT CHANGED   Details  furosemide (LASIX) 20 MG tablet Take 1 tablet (20 mg total) by mouth daily. As directed by physician Qty: 30 tablet, Refills: 1       No Known Allergies    The results of significant diagnostics from this hospitalization (including imaging, microbiology, ancillary and laboratory) are listed below for reference.    Significant Diagnostic Studies: Dg Chest 2 View  04/22/2015  CLINICAL DATA:  Mid chest pain.  Confusion.  Low saturation. EXAM:  CHEST  2 VIEW COMPARISON:  CT chest 10/05/2010.  Chest 10/05/2010. FINDINGS: Postoperative changes in the mediastinum. Normal heart size and pulmonary vascularity. Calcification and tortuosity of the aorta. Mediastinal contours appear intact. Linear scarring or atelectasis in the lung bases. No focal airspace disease in the lungs. No blunting of costophrenic angles. No pneumothorax. Degenerative changes in the spine. IMPRESSION: Linear atelectasis or fibrosis in the lung bases. No focal consolidation. Electronically Signed   By: Burman Nieves M.D.   On: 04/22/2015 19:27     Microbiology: No results found for this or any previous visit (from the past 240 hour(s)).   Labs: Basic Metabolic Panel:  Recent Labs Lab 04/22/15 1822 04/23/15 0520 04/24/15 0520  NA 138 139 140  K 4.3 3.6 3.7  CL 104 102 103  CO2 GLUCOSE 105* 127* 103*  BUN 19 16 24*  CREATININE 0.86 0.75 0.85  CALCIUM 9.1 8.8* 9.0   Liver Function Tests:  Recent Labs Lab 04/22/15 1822  AST 22  ALT 10*  ALKPHOS 93  BILITOT 0.8  PROT 7.5  ALBUMIN 3.8   No results for input(s): LIPASE, AMYLASE in the last 168 hours. No results for input(s): AMMONIA in the last 168 hours. CBC:  Recent Labs Lab 04/22/15 1822  WBC 6.5  HGB 14.7  HCT 44.5  MCV 92.7  PLT 267   Cardiac Enzymes: No results for input(s): CKTOTAL, CKMB, CKMBINDEX, TROPONINI in the last 168 hours. BNP: BNP (last 3 results)  Recent Labs  04/22/15 1822  BNP 126.9*    ProBNP (last 3 results)  Recent Labs  04/11/15 1745  PROBNP 1715.00*    CBG:  Recent Labs Lab 04/22/15 1826  GLUCAP 107*    Signed:  Penny Pia  [facility] 04/24/2015, 12:30 PM

## 2015-04-24 NOTE — Clinical Documentation Improvement (Signed)
Internal Medicine  Can the diagnosis of Hypoxia be further specified? Please update your documentation within the medical record to reflect your response to this query. Do not document in BPA drop down box; place findings in progress note.Thank you!   Document Acuity - Acute, Chronic, Acute on Chronic  Document Inclusion Of - Hypoxia, Hypercapnia, Combination of Both  Other  Clinically Undetermined  Document any associated diagnoses/conditions.  Supporting Information:  Patient's respiratory rates were running from 18 to 29  O2 sat in ED states 83%  Placed in 2 to 3L of FiO2 with improvement seen  Please exercise your independent, professional judgment when responding. A specific answer is not anticipated or expected.  Thank You,  Shellee MiloEileen T Ashlynn Gunnels [facility] Information Management [place] [phone]; Cell: [phone]

## 2015-04-24 NOTE — NC FL2 (Signed)
[place] MEDICAID FL2 LEVEL OF CARE SCREENING TOOL     IDENTIFICATION  Patient Name: Jason York Birthdate: 12/24/1918 Sex: male Admission Date (Current Location): 04/22/2015  [facility] [place]   Facility and Address:  [facility],  [address], [place] [id number]      Provider Number: [id number]  Attending Physician Name and Address:  Penny Piarlando Vega, MD  Relative Name and Phone Number:       Current Level of Care: Hospital Recommended Level of Care: Skilled Nursing Facility Prior Approval Number:    Date Approved/Denied:   PASRR Number: [id number]  Discharge Plan:      Current Diagnoses: Patient Active Problem List   Diagnosis Date Noted  . Malnutrition of moderate degree 04/23/2015  . Dyspnea 04/22/2015  . CHF (congestive heart failure) (HCC) 04/22/2015  . Debility 04/22/2015  . Benign essential HTN 04/22/2015  . Dementia 04/22/2015    Orientation ACTIVITIES/SOCIAL BLADDER RESPIRATION    Self, Time, Situation, Place    Incontinent Normal  BEHAVIORAL SYMPTOMS/MOOD NEUROLOGICAL BOWEL NUTRITION STATUS      Incontinent    PHYSICIAN VISITS COMMUNICATION OF NEEDS Height & Weight Skin    Verbally   123 lbs. Normal          AMBULATORY STATUS RESPIRATION    Assist extensive Normal      Personal Care Assistance Level of Assistance  Bathing, Feeding, Dressing Bathing Assistance: Limited assistance Feeding assistance: Limited assistance Dressing Assistance: Limited assistance      Functional Limitations Info                SPECIAL CARE FACTORS FREQUENCY  PT (By licensed PT), OT (By licensed OT)     PT Frequency: daily OT Frequency: daily           Additional Factors Info  Allergies, Code Status Code Status Info: DNR Allergies Info: NKA           Current Medications (04/24/2015): Current Facility-Administered Medications  Medication Dose Route Frequency Provider Last Rate Last Dose  .  acetaminophen (TYLENOL) tablet 650 mg  650 mg Oral Q6H PRN Clydie Braunondell A Smith, MD       Or  . acetaminophen (TYLENOL) suppository 650 mg  650 mg Rectal Q6H PRN Clydie Braunondell A Smith, MD      . amLODipine (NORVASC) tablet 10 mg  10 mg Oral Daily Zannie CovePreetha Joseph, MD   10 mg at 04/23/15 1455  . enoxaparin (LOVENOX) injection 40 mg  40 mg Subcutaneous QHS Clydie Braunondell A Smith, MD   40 mg at 04/23/15 2203  . feeding supplement (ENSURE ENLIVE) (ENSURE ENLIVE) liquid 237 mL  237 mL Oral BID BM Anderson MaltaJessica M Ostheim, RD   237 mL at 04/23/15 1456  . hydrALAZINE (APRESOLINE) injection 10 mg  10 mg Intravenous Q4H PRN Clydie Braunondell A Smith, MD   10 mg at 04/23/15 0641  . polyethylene glycol (MIRALAX / GLYCOLAX) packet 17 g  17 g Oral Daily Zannie CovePreetha Joseph, MD   17 g at 04/23/15 1456  . sodium chloride 0.9 % injection 3 mL  3 mL Intravenous Q12H Rondell Burtis JunesA Smith, MD   3 mL at 04/23/15 2200   Do not use this list as official medication orders. Please verify with discharge summary.  Discharge Medications:   Medication List    ASK your doctor about these medications        furosemide 20 MG tablet  Commonly known as:  LASIX  Take 1 tablet (20 mg  total) by mouth daily. As directed by physician        Relevant Imaging Results:  Relevant Lab Results:  Recent Labs    Additional Information SSN: [id number]  Rondel Baton, LCSW

## 2015-04-24 NOTE — Progress Notes (Signed)
Occupational Therapy Evaluation Patient Details Name: Jason PerkingHarold E Brooks MRN: [id number] DOB: 08/20/1918 Today's Date: 04/24/2015    History of Present Illness 81 yo male admitted with CHF and atelectasis, PMH significant for hypertension, and dementia that got progressively worse over the last year to the point that pt is not able to take care of himself and has had multiple falls   Clinical Impression   Pt admitted with the above diagnoses and presents with below problem list. Pt will benefit from continued acute OT to address the below listed deficits and maximize independence with BADLs prior to d/c to venue below. Difficult to determine PLOF as pt is poor historian, no family present to confirm baseline ADLs. Per chart review it appears pt was ambulatory but with history of recent falls. Pt is currently +2 max with LB ADLs and functional transfers. Limited evaluation. Session details below. OT to continue to follow acutely.     Follow Up Recommendations  SNF    Equipment Recommendations  Other (comment) (TBD next venue)    Recommendations for Other Services       Precautions / Restrictions Precautions Precautions: Fall Restrictions Weight Bearing Restrictions: No      Mobility Bed Mobility                  Transfers                      Balance                                            ADL Overall ADL's : Needs assistance/impaired Eating/Feeding: Sitting;Set up   Grooming: Wash/dry face;Wash/dry hands;Sitting;Moderate assistance Grooming Details (indicate cue type and reason): Pt with both hands on washcloth to support bringing to face. Min A due decreased attention to task and activity tolerance Upper Body Bathing: Maximal assistance;Bed level   Lower Body Bathing: Maximal assistance;+2 for physical assistance;Sit to/from stand   Upper Body Dressing : Bed level;Maximal assistance   Lower Body Dressing: Maximal assistance;+2 for  physical assistance;Sit to/from stand   Toilet Transfer: Maximal assistance;+2 for physical assistance;Stand-pivot;BSC;RW   Toileting- Clothing Manipulation and Hygiene: Maximal assistance;+2 for physical assistance;Sit to/from stand   Tub/ Shower Transfer: Maximal assistance;Stand-pivot;3 in 1;Rolling walker     General ADL Comments: Bedside eval only as pt is +2 for bed mobility and only +1 assist available. Based on findings during evaluation and chart review pt with generalized weakness of BUE/BLE and impaired sitting/standing balance impacting level of assist with ADLs. Pt completed washing face and applying lotion sitting in bed as detailed above. Pt able to converse but confused.      Vision     Perception     Praxis      Pertinent Vitals/Pain Pain Assessment: Faces Faces Pain Scale: No hurt     Hand Dominance Right   Extremity/Trunk Assessment Upper Extremity Assessment Upper Extremity Assessment: Generalized weakness   Lower Extremity Assessment Lower Extremity Assessment: Defer to PT evaluation;Generalized weakness       Communication Communication Communication: No difficulties;Other (comment) (decreased cogntion)   Cognition Arousal/Alertness: Awake/alert;Lethargic Behavior During Therapy: Flat affect Overall Cognitive Status: No family/caregiver present to determine baseline cognitive functioning Area of Impairment: Orientation;Attention;Memory;Following commands;Safety/judgement;Awareness;Problem solving Orientation Level: Time;Situation Current Attention Level: Focused Memory: Decreased recall of precautions;Decreased short-term memory Following Commands: Follows one step commands inconsistently;Follows one  step commands with increased time Safety/Judgement: Decreased awareness of safety;Decreased awareness of deficits   Problem Solving: Slow processing;Decreased initiation;Difficulty sequencing;Requires verbal cues;Requires tactile cues     General  Comments       Exercises       Shoulder Instructions      Home Living Family/patient expects to be discharged to:: Skilled nursing facility Living Arrangements: Spouse/significant other                                      Prior Functioning/Environment Level of Independence: Needs assistance        Comments: pt is poor historian, no family present to confirm baseline ADLs; per chart review it appears pt was ambulatory with history of recent falls    OT Diagnosis: Generalized weakness;Cognitive deficits   OT Problem List: Decreased strength;Decreased activity tolerance;Impaired balance (sitting and/or standing);Decreased cognition;Decreased safety awareness;Decreased knowledge of use of DME or AE;Decreased knowledge of precautions   OT Treatment/Interventions: Self-care/ADL training;Therapeutic exercise;DME and/or AE instruction;Therapeutic activities;Cognitive remediation/compensation;Patient/family education;Balance training    OT Goals(Current goals can be found in the care plan section) Acute Rehab OT Goals Patient Stated Goal: none stated  OT Goal Formulation: Patient unable to participate in goal setting Time For Goal Achievement: 05/01/15 Potential to Achieve Goals: Fair ADL Goals Pt Will Perform Grooming: with set-up Pt Will Perform Upper Body Bathing: with min assist;sitting Pt Will Perform Lower Body Bathing: sit to/from stand;with mod assist Pt Will Perform Upper Body Dressing: with min assist;sitting Pt Will Perform Lower Body Dressing: with mod assist;sit to/from stand Pt Will Transfer to Toilet: with min assist;ambulating;bedside commode Pt Will Perform Tub/Shower Transfer: Stand pivot transfer;with min assist;3 in 1;rolling walker  OT Frequency: Min 2X/week   Barriers to D/C:            Co-evaluation              End of Session    Activity Tolerance: Patient limited by lethargy Patient left: in bed;with call bell/phone within  reach;with bed alarm set;with SCD's reapplied   Time: 4098-1191 OT Time Calculation (min): 15 min Charges:  OT General Charges $OT Visit: 1 Procedure OT Evaluation $Initial OT Evaluation Tier I: 1 Procedure G-Codes:    Pilar Grammes 05/17/2015, 1:13 PM

## 2015-04-24 NOTE — Discharge Planning (Signed)
Patient will discharge today per [place] order. [facility] RN to call report prior to transportation to: [phone] Transportation: [facility]- to be scheduled at 2:30pm  CSW sent discharge summary to SNF for review.  Packet is complete.  RN, patient and family aware of discharge plans.  Jason PennaGina Jaysiah Marchetta, LCSWA [phone]  Psychiatric & Orthopedics (5N 1-16) Clinical Social Worker

## 2015-04-24 NOTE — Progress Notes (Signed)
Marlynn PerkingHarold E Branden to be D/C'd Skilled nursing facility per MD order.  Discussed prescriptions and follow up appointments with the patient. Prescriptions given to patient, medication list explained in detail. Pt verbalized understanding.    Medication List    TAKE these medications        feeding supplement (ENSURE ENLIVE) Liqd  Take 237 mLs by mouth 2 (two) times daily between meals.     furosemide 20 MG tablet  Commonly known as:  LASIX  Take 1 tablet (20 mg total) by mouth daily. As directed by physician        Filed Vitals:   04/23/15 2235 04/24/15 1323  BP: 129/81 144/84  Pulse: 69   Temp: 97.8 F (36.6 C) 97.8 F (36.6 C)  Resp: 20 20    Skin clean, dry and intact without evidence of skin break down, no evidence of skin tears noted. IV catheter discontinued intact. Site without signs and symptoms of complications. Dressing and pressure applied. Pt denies pain at this time. No complaints noted.  Patient transferred to facility via [facility].   Rondel JumboDumas, Kveon Casanas S 04/24/2015 4:39 PM

## 2015-04-24 NOTE — Clinical Social Work Note (Addendum)
11:52am- CSW spoke with [place] who lives in [place] regarding placement for patient.  Daughter is agreeable to SNF placement and is assisting patient's wife in decision making.  CSW notified daughter of patient's possible discharge for today.  Daughter is aware and agreeable to discharge.  CSW awaiting a return call from patient's wife in regards to SNF choice.  9:56am- CSW reviewed bed offers with wife.  Wife states she [facility] prior to making a decision.  CSW advised [facility] of the requested tour and possible admission for today.  [facility] is aware and agreeable.  Wife will contact this CSW after noon to advise of SNF decision.  Jason PennaGina Leen Tworek, LCSW [phone]  [facility] Licensed Clinical Social Worker

## 2015-05-07 ENCOUNTER — Emergency Department (HOSPITAL_COMMUNITY): Payer: Medicare Other

## 2015-05-07 ENCOUNTER — Encounter (HOSPITAL_COMMUNITY): Payer: Self-pay | Admitting: Family Medicine

## 2015-05-07 ENCOUNTER — Inpatient Hospital Stay (HOSPITAL_COMMUNITY)
Admission: EM | Admit: 2015-05-07 | Discharge: 2015-06-02 | DRG: 871 | Disposition: E | Payer: Medicare Other | Attending: Internal Medicine | Admitting: Internal Medicine

## 2015-05-07 DIAGNOSIS — Z79899 Other long term (current) drug therapy: Secondary | ICD-10-CM | POA: Diagnosis not present

## 2015-05-07 DIAGNOSIS — Z515 Encounter for palliative care: Secondary | ICD-10-CM | POA: Diagnosis present

## 2015-05-07 DIAGNOSIS — Z66 Do not resuscitate: Secondary | ICD-10-CM | POA: Diagnosis present

## 2015-05-07 DIAGNOSIS — N179 Acute kidney failure, unspecified: Secondary | ICD-10-CM | POA: Diagnosis present

## 2015-05-07 DIAGNOSIS — I10 Essential (primary) hypertension: Secondary | ICD-10-CM | POA: Diagnosis present

## 2015-05-07 DIAGNOSIS — N390 Urinary tract infection, site not specified: Secondary | ICD-10-CM | POA: Diagnosis not present

## 2015-05-07 DIAGNOSIS — I5032 Chronic diastolic (congestive) heart failure: Secondary | ICD-10-CM | POA: Diagnosis present

## 2015-05-07 DIAGNOSIS — N3289 Other specified disorders of bladder: Secondary | ICD-10-CM | POA: Diagnosis present

## 2015-05-07 DIAGNOSIS — Z7401 Bed confinement status: Secondary | ICD-10-CM

## 2015-05-07 DIAGNOSIS — G9340 Encephalopathy, unspecified: Secondary | ICD-10-CM | POA: Diagnosis present

## 2015-05-07 DIAGNOSIS — A419 Sepsis, unspecified organism: Secondary | ICD-10-CM | POA: Diagnosis present

## 2015-05-07 DIAGNOSIS — I2510 Atherosclerotic heart disease of native coronary artery without angina pectoris: Secondary | ICD-10-CM | POA: Diagnosis present

## 2015-05-07 DIAGNOSIS — E870 Hyperosmolality and hypernatremia: Secondary | ICD-10-CM | POA: Diagnosis present

## 2015-05-07 DIAGNOSIS — R6251 Failure to thrive (child): Secondary | ICD-10-CM

## 2015-05-07 DIAGNOSIS — Z951 Presence of aortocoronary bypass graft: Secondary | ICD-10-CM

## 2015-05-07 DIAGNOSIS — I5022 Chronic systolic (congestive) heart failure: Secondary | ICD-10-CM | POA: Diagnosis not present

## 2015-05-07 DIAGNOSIS — E43 Unspecified severe protein-calorie malnutrition: Secondary | ICD-10-CM

## 2015-05-07 DIAGNOSIS — F0390 Unspecified dementia without behavioral disturbance: Secondary | ICD-10-CM | POA: Diagnosis present

## 2015-05-07 DIAGNOSIS — E860 Dehydration: Secondary | ICD-10-CM | POA: Diagnosis present

## 2015-05-07 DIAGNOSIS — R0600 Dyspnea, unspecified: Secondary | ICD-10-CM | POA: Diagnosis not present

## 2015-05-07 DIAGNOSIS — R627 Adult failure to thrive: Secondary | ICD-10-CM | POA: Diagnosis present

## 2015-05-07 DIAGNOSIS — E785 Hyperlipidemia, unspecified: Secondary | ICD-10-CM | POA: Diagnosis present

## 2015-05-07 HISTORY — DX: Unspecified dementia, severe, without behavioral disturbance, psychotic disturbance, mood disturbance, and anxiety: F03.C0

## 2015-05-07 HISTORY — DX: Hyperlipidemia, unspecified: E78.5

## 2015-05-07 HISTORY — DX: Heart failure, unspecified: I50.9

## 2015-05-07 HISTORY — DX: Essential (primary) hypertension: I10

## 2015-05-07 HISTORY — DX: Atherosclerotic heart disease of native coronary artery without angina pectoris: I25.10

## 2015-05-07 HISTORY — DX: Unspecified dementia without behavioral disturbance: F03.90

## 2015-05-07 LAB — CBC WITH DIFFERENTIAL/PLATELET
Basophils Absolute: 0 10*3/uL (ref 0.0–0.1)
Basophils Relative: 0 %
EOS PCT: 0 %
Eosinophils Absolute: 0 10*3/uL (ref 0.0–0.7)
HEMATOCRIT: 39.9 % (ref 39.0–52.0)
Hemoglobin: 13.3 g/dL (ref 13.0–17.0)
LYMPHS PCT: 5 %
Lymphs Abs: 0.9 10*3/uL (ref 0.7–4.0)
MCH: 30.6 pg (ref 26.0–34.0)
MCHC: 33.3 g/dL (ref 30.0–36.0)
MCV: 91.9 fL (ref 78.0–100.0)
MONO ABS: 0.8 10*3/uL (ref 0.1–1.0)
MONOS PCT: 5 %
NEUTROS ABS: 15.1 10*3/uL — AB (ref 1.7–7.7)
Neutrophils Relative %: 90 %
PLATELETS: 135 10*3/uL — AB (ref 150–400)
RBC: 4.34 MIL/uL (ref 4.22–5.81)
RDW: 14.4 % (ref 11.5–15.5)
WBC: 16.8 10*3/uL — ABNORMAL HIGH (ref 4.0–10.5)

## 2015-05-07 LAB — COMPREHENSIVE METABOLIC PANEL
ALT: 13 U/L — ABNORMAL LOW (ref 17–63)
ANION GAP: 14 (ref 5–15)
AST: 16 U/L (ref 15–41)
Albumin: 2.7 g/dL — ABNORMAL LOW (ref 3.5–5.0)
Alkaline Phosphatase: 69 U/L (ref 38–126)
BILIRUBIN TOTAL: 0.6 mg/dL (ref 0.3–1.2)
BUN: 169 mg/dL — AB (ref 6–20)
CO2: 18 mmol/L — ABNORMAL LOW (ref 22–32)
Calcium: 8.1 mg/dL — ABNORMAL LOW (ref 8.9–10.3)
Chloride: 112 mmol/L — ABNORMAL HIGH (ref 101–111)
Creatinine, Ser: 8.49 mg/dL — ABNORMAL HIGH (ref 0.61–1.24)
GFR, EST AFRICAN AMERICAN: 5 mL/min — AB (ref >60)
GFR, EST NON AFRICAN AMERICAN: 5 mL/min — AB (ref >60)
Glucose, Bld: 167 mg/dL — ABNORMAL HIGH (ref 65–99)
POTASSIUM: 5.3 mmol/L — AB (ref 3.5–5.1)
Sodium: 144 mmol/L (ref 135–145)
TOTAL PROTEIN: 6.4 g/dL — AB (ref 6.5–8.1)

## 2015-05-07 LAB — CBC
HEMATOCRIT: 38.9 % — AB (ref 39.0–52.0)
HEMOGLOBIN: 13 g/dL (ref 13.0–17.0)
MCH: 30.7 pg (ref 26.0–34.0)
MCHC: 33.4 g/dL (ref 30.0–36.0)
MCV: 92 fL (ref 78.0–100.0)
Platelets: 147 10*3/uL — ABNORMAL LOW (ref 150–400)
RBC: 4.23 MIL/uL (ref 4.22–5.81)
RDW: 14.7 % (ref 11.5–15.5)
WBC: 16.5 10*3/uL — ABNORMAL HIGH (ref 4.0–10.5)

## 2015-05-07 LAB — CBG MONITORING, ED
GLUCOSE-CAPILLARY: 147 mg/dL — AB (ref 65–99)
GLUCOSE-CAPILLARY: 144 mg/dL — AB (ref 65–99)
GLUCOSE-CAPILLARY: 109 mg/dL — AB (ref 65–99)

## 2015-05-07 LAB — URINALYSIS, ROUTINE W REFLEX MICROSCOPIC
BILIRUBIN URINE: NEGATIVE
Glucose, UA: NEGATIVE mg/dL
KETONES UR: NEGATIVE mg/dL
NITRITE: NEGATIVE
PH: 6 (ref 5.0–8.0)
Protein, ur: NEGATIVE mg/dL
SPECIFIC GRAVITY, URINE: 1.011 (ref 1.005–1.030)

## 2015-05-07 LAB — URINE MICROSCOPIC-ADD ON

## 2015-05-07 LAB — I-STAT ARTERIAL BLOOD GAS, ED
Acid-base deficit: 7 mmol/L — ABNORMAL HIGH (ref 0.0–2.0)
BICARBONATE: 17.9 meq/L — AB (ref 20.0–24.0)
O2 SAT: 92 %
PCO2 ART: 36.4 mmHg (ref 35.0–45.0)
Patient temperature: 100.9
TCO2: 19 mmol/L (ref 0–100)
pH, Arterial: 7.305 — ABNORMAL LOW (ref 7.350–7.450)
pO2, Arterial: 73 mmHg — ABNORMAL LOW (ref 80.0–100.0)

## 2015-05-07 LAB — CREATININE, SERUM
Creatinine, Ser: 4.22 mg/dL — ABNORMAL HIGH (ref 0.61–1.24)
GFR, EST AFRICAN AMERICAN: 13 mL/min — AB (ref >60)
GFR, EST NON AFRICAN AMERICAN: 11 mL/min — AB (ref >60)

## 2015-05-07 LAB — PROTIME-INR
INR: 1.47 (ref 0.00–1.49)
Prothrombin Time: 17.9 seconds — ABNORMAL HIGH (ref 11.6–15.2)

## 2015-05-07 LAB — MAGNESIUM: Magnesium: 2.5 mg/dL — ABNORMAL HIGH (ref 1.7–2.4)

## 2015-05-07 LAB — PHOSPHORUS: PHOSPHORUS: 4.8 mg/dL — AB (ref 2.5–4.6)

## 2015-05-07 LAB — MRSA PCR SCREENING: MRSA by PCR: NEGATIVE

## 2015-05-07 LAB — LACTIC ACID, PLASMA: Lactic Acid, Venous: 1.6 mmol/L (ref 0.5–2.0)

## 2015-05-07 LAB — GLUCOSE, CAPILLARY: Glucose-Capillary: 106 mg/dL — ABNORMAL HIGH (ref 65–99)

## 2015-05-07 LAB — PROCALCITONIN: Procalcitonin: 0.86 ng/mL

## 2015-05-07 LAB — I-STAT CG4 LACTIC ACID, ED
LACTIC ACID, VENOUS: 1.84 mmol/L (ref 0.5–2.0)
Lactic Acid, Venous: 1.98 mmol/L (ref 0.5–2.0)

## 2015-05-07 LAB — APTT: APTT: 33 s (ref 24–37)

## 2015-05-07 MED ORDER — VANCOMYCIN HCL 500 MG IV SOLR: 500.0000 mg | INTRAVENOUS | Status: DC

## 2015-05-07 MED ORDER — ACETAMINOPHEN 650 MG RE SUPP
650.0000 mg | Freq: Once | RECTAL | Status: AC
  Administered 2015-05-07: 650 mg via RECTAL
  Filled 2015-05-07: qty 1

## 2015-05-07 MED ORDER — PIPERACILLIN-TAZOBACTAM IN DEX 2-0.25 GM/50ML IV SOLN
2.2500 g | Freq: Three times a day (TID) | INTRAVENOUS | Status: DC
  Administered 2015-05-07 – 2015-05-08 (×4): 2.25 g via INTRAVENOUS
  Filled 2015-05-07 (×5): qty 50

## 2015-05-07 MED ORDER — INSULIN ASPART 100 UNIT/ML ~~LOC~~ SOLN: 0.0000 [IU] | Freq: Every day | SUBCUTANEOUS | Status: DC

## 2015-05-07 MED ORDER — PIPERACILLIN-TAZOBACTAM 3.375 G IVPB 30 MIN
3.3750 g | Freq: Once | INTRAVENOUS | Status: AC
Start: 2015-05-07 — End: 2015-05-07
  Administered 2015-05-07: 3.375 g via INTRAVENOUS
  Filled 2015-05-07: qty 50

## 2015-05-07 MED ORDER — BIOTENE DRY MOUTH MT LIQD: 15.0000 mL | OROMUCOSAL | Status: DC | PRN

## 2015-05-07 MED ORDER — SODIUM CHLORIDE 0.9 % IV BOLUS (SEPSIS)
1650.0000 mL | Freq: Once | INTRAVENOUS | Status: AC
  Administered 2015-05-07: 1650 mL via INTRAVENOUS

## 2015-05-07 MED ORDER — SODIUM CHLORIDE 0.9 % IJ SOLN
3.0000 mL | Freq: Two times a day (BID) | INTRAMUSCULAR | Status: DC
  Administered 2015-05-07 – 2015-05-09 (×3): 3 mL via INTRAVENOUS

## 2015-05-07 MED ORDER — HEPARIN SODIUM (PORCINE) 5000 UNIT/ML IJ SOLN
5000.0000 [IU] | Freq: Three times a day (TID) | INTRAMUSCULAR | Status: DC
  Administered 2015-05-07 – 2015-05-09 (×7): 5000 [IU] via SUBCUTANEOUS
  Filled 2015-05-07 (×7): qty 1

## 2015-05-07 MED ORDER — SODIUM CHLORIDE 0.9 % IV BOLUS (SEPSIS)
500.0000 mL | Freq: Once | INTRAVENOUS | Status: AC
  Administered 2015-05-07: 500 mL via INTRAVENOUS

## 2015-05-07 MED ORDER — HYDRALAZINE HCL 20 MG/ML IJ SOLN: 5.0000 mg | INTRAMUSCULAR | Status: DC | PRN

## 2015-05-07 MED ORDER — ONDANSETRON HCL 4 MG/2ML IJ SOLN: 4.0000 mg | Freq: Four times a day (QID) | INTRAMUSCULAR | Status: DC | PRN

## 2015-05-07 MED ORDER — SODIUM CHLORIDE 0.9 % IV SOLN
INTRAVENOUS | Status: DC
  Administered 2015-05-07 – 2015-05-08 (×3): via INTRAVENOUS

## 2015-05-07 MED ORDER — CETYLPYRIDINIUM CHLORIDE 0.05 % MT LIQD
7.0000 mL | Freq: Two times a day (BID) | OROMUCOSAL | Status: DC
  Administered 2015-05-08 – 2015-05-09 (×4): 7 mL via OROMUCOSAL

## 2015-05-07 MED ORDER — INSULIN ASPART 100 UNIT/ML ~~LOC~~ SOLN: 0.0000 [IU] | Freq: Three times a day (TID) | SUBCUTANEOUS | Status: DC

## 2015-05-07 MED ORDER — ONDANSETRON HCL 4 MG PO TABS: 4.0000 mg | ORAL_TABLET | Freq: Four times a day (QID) | ORAL | Status: DC | PRN

## 2015-05-07 MED ORDER — CHLORHEXIDINE GLUCONATE 0.12 % MT SOLN
15.0000 mL | Freq: Two times a day (BID) | OROMUCOSAL | Status: DC
  Administered 2015-05-07 – 2015-05-09 (×4): 15 mL via OROMUCOSAL

## 2015-05-07 MED ORDER — PIPERACILLIN-TAZOBACTAM 3.375 G IVPB 30 MIN: 3.3750 g | Freq: Three times a day (TID) | INTRAVENOUS | Status: DC

## 2015-05-07 MED ORDER — VANCOMYCIN HCL IN DEXTROSE 1-5 GM/200ML-% IV SOLN
1000.0000 mg | Freq: Once | INTRAVENOUS | Status: AC
  Administered 2015-05-07: 1000 mg via INTRAVENOUS
  Filled 2015-05-07: qty 200

## 2015-05-07 NOTE — Progress Notes (Signed)
Paged by nursing staff - Hope Pt w/ 1800 UOP fron I/O Appreciate being made aware of urinary retention Will place foley for bladder decompression  Shelly Flattenavid Eboney Claybrook, MD Family Medicine 05/06/2015, 2:57 PM

## 2015-05-07 NOTE — Consult Note (Signed)
Consultation Note Date: 05/20/2015   Patient Name: Jason York  DOB: 1919-01-08  MRN: [id number]  Age / Sex: 81 y.o., male  PCP: Provider Default, MD Referring Physician: Rolland Porter, MD  Reason for Consultation: Establishing goals of care    Clinical Assessment/Narrative:   81 year old male presenting in altered mental state from [facility] nursing home. Patient was discharged from the hospital on 04/23/2015 after treatment for CHF exacerbation and altered mentation.    At baseline patient will recognize some names and speak a few words but is total care for all ADLs  Consult is for review of medical treatment options, clarification of goals of care and end of life issues, disposition and options, and symptom recommendation.  This NP Lorinda Creed reviewed medical records, received report from team, assessed the patient and then meet at the patient's bedside along with his wife and son  to discuss diagnosis prognosis, GOC, EOL wishes disposition and options.   A detailed discussion was had today regarding advanced directives.  Concepts specific to code status, artifical feeding and hydration, continued IV antibiotics and rehospitalization was had.  The difference between a aggressive medical intervention path  and a palliative comfort care path for this patient at this time was had.  Values and goals of care important to patient and family were attempted to be elicited.  Discussed natural trajectory of dementia   Family reports continued physical, functional dn cognitive decline.  Concept of Hospice and Palliative Care were discussed  Natural trajectory and expectations at EOL were discussed.  Questions and concerns addressed.  Family encouraged to call with questions or concerns.  PMT will continue to support holistically.   Primary Decision Maker: wife   HCPOA: yes    SUMMARY OF RECOMMENDATIONS  -At  this time treat the treatable and make decisions based on outcomes, re-evaluate in the morning   Code Status/Advance Care Planning:  DNR    Code Status Orders        Start     Ordered   06/01/2015 1051  Do not attempt resuscitation (DNR)   Continuous    Question Answer Comment  In the event of cardiac or respiratory ARREST Do not call a "code blue"   In the event of cardiac or respiratory ARREST Do not perform Intubation, CPR, defibrillation or ACLS   In the event of cardiac or respiratory ARREST Use medication by any route, position, wound care, and other measures to relive pain and suffering. May use oxygen, suction and manual treatment of airway obstruction as needed for comfort.      05/15/2015 1109      Other Directives:None   Palliative Prophylaxis:   Aspiration, Delirium Protocol, Frequent Pain Assessment and Oral Care   Psycho-social/Spiritual:  Support System: Strong Desire for further Chaplaincy support:no Additional Recommendations: Education on Hospice  Prognosis:  Pending life prolonging interventions decsions  Discharge Planning:  Pending   Chief Complaint/ Primary Diagnoses: Present on Admission:  . Sepsis (HCC) . Dementia  I have reviewed the medical record, interviewed the patient and family, and examined the patient. The following aspects are pertinent.  Past Medical History  Diagnosis Date  . Heart failure (HCC)   . HTN (hypertension)   . CAD (coronary artery disease)   . HLD (hyperlipidemia)   . Advanced dementia    Social History   Social History  . Marital Status: Married    Spouse Name: N/A  . Number of Children: N/A  . Years of Education: N/A  Social History Main Topics  . Smoking status: Never Smoker   . Smokeless tobacco: Never Used  . Alcohol Use: 1.2 - 1.8 oz/week    2-3 Standard drinks or equivalent per week     Comment: occassional  . Drug Use: No  . Sexual Activity: Not Currently   Other Topics Concern  . Not on file    Social History Narrative   Family History  Problem Relation Age of Onset  . Stroke Sister    Scheduled Meds: . heparin  5,000 Units Subcutaneous 3 times per day  . insulin aspart  0-5 Units Subcutaneous QHS  . insulin aspart  0-9 Units Subcutaneous TID WC  . sodium chloride  3 mL Intravenous Q12H   Continuous Infusions: . sodium chloride 100 mL/hr at 16-May-2015 1113  . piperacillin-tazobactam (ZOSYN)  IV     PRN Meds:.antiseptic oral rinse, hydrALAZINE, ondansetron **OR** ondansetron (ZOFRAN) IV Medications Prior to Admission:  Prior to Admission medications   Medication Sig Start Date End Date Taking? Authorizing Provider  feeding supplement, ENSURE ENLIVE, (ENSURE ENLIVE) LIQD Take 237 mLs by mouth 2 (two) times daily between meals. 04/24/15  Yes Penny Pia, MD  furosemide (LASIX) 20 MG tablet Take 1 tablet (20 mg total) by mouth daily. As directed by physician Patient taking differently: Take 20 mg by mouth daily.  04/11/15  Yes Sherren Mocha, MD  UNABLE TO FIND Take 120 mLs by mouth 3 (three) times daily. Med Name: Med Pass   Yes Historical Provider, MD   No Known Allergies  Review of Systems  Unable to perform ROS   Physical Exam  Constitutional: He appears lethargic. He appears cachectic. He has a sickly appearance.  HENT:  Mouth/Throat: Abnormal dentition. Oropharyngeal exudate present.  Cardiovascular: Tachycardia present.   Respiratory: He has decreased breath sounds in the right lower field and the left lower field.  Neurological: He appears lethargic.  Skin: Skin is warm and dry.    Vital Signs: BP 146/91 mmHg  Pulse 96  Temp(Src) 97.4 F (36.3 C) (Axillary)  Resp 29  Ht  (1.727 m)  Wt 51.71 kg (114 lb)  BMI 17.34 kg/m2  SpO2 100%  SpO2: SpO2: 100 % O2 Device:SpO2: 100 % O2 Flow Rate: .O2 Flow Rate (L/min): 2 L/min  IO: Intake/output summary:  Intake/Output Summary (Last 24 hours) at 05-16-2015 1614 Last data filed at 05/16/2015 1516  Gross per  24 hour  Intake   2400 ml  Output   3850 ml  Net  -1450 ml    LBM:   Baseline Weight: Weight: 51.71 kg (114 lb) Most recent weight: Weight: 51.71 kg (114 lb)      Palliative Assessment/Data:    Additional Data Reviewed:  CBC:    Component Value Date/Time   WBC 16.8* May 16, 2015 0812   HGB 13.3 05/16/15 0812   HCT 39.9 2015/05/16 0812   PLT 135* 05-16-15 0812   MCV 91.9 05-16-15 0812   NEUTROABS 15.1* 05/16/15 0812   LYMPHSABS 0.9 05/16/15 0812   MONOABS 0.8 May 16, 2015 0812   EOSABS 0.0 2015-05-16 0812   BASOSABS 0.0 05/16/2015 0812   Comprehensive Metabolic Panel:    Component Value Date/Time   NA 144 May 16, 2015 0812   K 5.3* 05/16/2015 0812   CL 112* 16-May-2015 0812   CO2 18* 2015-05-16 0812   BUN 169* 16-May-2015 0812   CREATININE 8.49* May 16, 2015 0812   CREATININE 0.90 04/11/2015 1745   GLUCOSE 167* 2015/05/16 0812   CALCIUM 8.1*  2015-03-09 0812   AST 16 2015-03-09 0812   ALT 13* 2015-03-09 0812   ALKPHOS 69 2015-03-09 0812   BILITOT 0.6 2015-03-09 0812   PROT 6.4* 2015-03-09 0812   ALBUMIN 2.7* 2015-03-09 [id number]     Time In: 1500 Time Out: 1630 Time Total: 90 min Greater than 50%  of this time was spent counseling and coordinating care related to the above assessment and plan.  Signed by: Lorinda CreedLARACH, Ryler Laskowski, NP  Canary BrimMary W Cylinda Santoli, NP  05/23/2015, 4:14 PM  Please contact Palliative Medicine Team phone at [phone] for questions and concerns.

## 2015-05-07 NOTE — ED Notes (Signed)
Notified pt's wife that pt is being transported to stepdown

## 2015-05-07 NOTE — ED Notes (Signed)
Wifes (home)[phone] wifes (cell)[phone].... Please call when Pt has a bed

## 2015-05-07 NOTE — ED Provider Notes (Addendum)
CSN: [id number]     Arrival date & time 05/13/2015  [id number] History   First MD Initiated Contact with Patient 07-14-14 [phone]     Chief Complaint  Patient presents with  . Code Sepsis      HPI  Patient presents for evaluation of decreased level of consciousness.  She has a history of dementia. [facility].  Was admitted with shortness of breath and dementia. Found to be in some congestive heart failure. Was discharged on Lasix. That was a new medication for him. Wife states that 2 years ago he decided to quit seeing doctors and quit taking medications. Reports slow mental deterioration.  However, he has been responding to family as recently as Thanksgiving. Recognizing names. Speaking a few words at a time. However is not ambulatory and is mostly bed/chair bound.  This morning was not verbally responsive to staff and was tachypneic and was transferred here by EMS.  No past medical history on file. Past Surgical History  Procedure Laterality Date  . Tonsillectomy    . Appendectomy     Family History  Problem Relation Age of Onset  . Stroke Sister    Social History  Substance Use Topics  . Smoking status: Never Smoker   . Smokeless tobacco: Never Used  . Alcohol Use: 1.2 - 1.8 oz/week    2-3 Standard drinks or equivalent per week     Comment: occassional    Review of Systems  Unable to perform ROS: Acuity of condition      Allergies  Review of patient's allergies indicates no known allergies.  Home Medications   Prior to Admission medications   Medication Sig Start Date End Date Taking? Authorizing Provider  feeding supplement, ENSURE ENLIVE, (ENSURE ENLIVE) LIQD Take 237 mLs by mouth 2 (two) times daily between meals. 04/24/15  Yes Penny Piarlando Vega, MD  furosemide (LASIX) 20 MG tablet Take 1 tablet (20 mg total) by mouth daily. As directed by physician Patient taking  differently: Take 20 mg by mouth daily.  04/11/15  Yes Sherren MochaEva N Shaw, MD  UNABLE TO FIND Take 120 mLs by mouth 3 (three) times daily. Med Name: Med Pass   Yes Historical Provider, MD   BP 161/90 mmHg  Pulse 101  Temp(Src) 100.9 F (38.3 C) (Rectal)  Resp 16  Ht 5\' 8"  (1.727 m)  Wt 114 lb (51.71 kg)  BMI 17.34 kg/m2  SpO2 95% Physical Exam  Constitutional: He appears listless. He appears cachectic. No distress.  HENT:  Head: Normocephalic.  Mouth/Throat:    Eyes: Conjunctivae are normal. Pupils are equal, round, and reactive to light. No scleral icterus.  Neck: Normal range of motion. Neck supple. No thyromegaly present.  Cardiovascular: Regular rhythm.  Tachycardia present.  Exam reveals no gallop and no friction rub.   No murmur heard. Tachycardic at 105.  Pulmonary/Chest: Effort normal and breath sounds normal. No respiratory distress. He has no wheezes. He has no rales.  Tachypneic. Respiratory rate 35-40. No abnormal breath sounds noted.  Abdominal: Soft. Bowel sounds are normal. He exhibits no distension. There is no tenderness. There is no rebound.  Musculoskeletal: Normal range of motion.  Neurological: He appears listless.  Chest pain. Not verbally responsive. Does withdraw all 4 extremities equally.  Skin: Skin is warm and dry. No rash noted.  Psychiatric: He has a normal mood and affect. His behavior is normal.    ED Course  Procedures (including critical care time) Labs Review Labs Reviewed  URINALYSIS, ROUTINE W REFLEX MICROSCOPIC (NOT AT [facility]) - Abnormal; Notable for the following:    APPearance CLOUDY (*)  [facility] Hgb urine dipstick SMALL (*)    Leukocytes, UA MODERATE (*)    All other components within normal limits  CBC WITH DIFFERENTIAL/PLATELET - Abnormal; Notable for the following:    WBC 16.8 (*)    Platelets 135 (*)    Neutro Abs 15.1 (*)    All other components within normal limits  COMPREHENSIVE METABOLIC PANEL - Abnormal; Notable for the following:     Potassium 5.3 (*)    Chloride 112 (*)    CO2 18 (*)    Glucose, Bld 167 (*)    BUN 169 (*)    Creatinine, Ser 8.49 (*)    Calcium 8.1 (*)    Total Protein 6.4 (*)    Albumin 2.7 (*)    ALT 13 (*)    GFR calc non Af Amer 5 (*)    GFR calc Af Amer 5 (*)    All other components within normal limits  URINE MICROSCOPIC-ADD ON - Abnormal; Notable for the following:    Squamous Epithelial / LPF 0-5 (*)    Bacteria, UA MANY (*)    All other components within normal limits  CBG MONITORING, ED - Abnormal; Notable for the following:    Glucose-Capillary 147 (*)    All other components within normal limits  I-STAT ARTERIAL BLOOD GAS, ED - Abnormal; Notable for the following:    pH, Arterial 7.305 (*)    pO2, Arterial 73.0 (*)    Bicarbonate 17.9 (*)    Acid-base deficit 7.0 (*)    All other components within normal limits  CULTURE, BLOOD (ROUTINE X 2)  CULTURE, BLOOD (ROUTINE X 2)  URINE CULTURE  CBC WITH DIFFERENTIAL/PLATELET  I-STAT CG4 LACTIC ACID, ED  I-STAT CG4 LACTIC ACID, ED    Imaging Review Dg Chest Portable 1 View  05/22/2015  CLINICAL DATA:  81 year old male with shortness of breath and abnormal breath sounds. Initial encounter. EXAM: PORTABLE CHEST 1 VIEW COMPARISON:  04/22/2015 and earlier. FINDINGS: Portable AP semi upright view at 0654 hours. Mildly lower lung volumes. Stable cardiac size and mediastinal contours. Sequelae of CABG. Allowing for portable technique, the lungs are clear. No pneumothorax or pulmonary edema. Mildly increased gas-filled bowel loops in the visualized upper abdomen. IMPRESSION: Mildly lower lung volumes.  The lungs remain clear. Electronically Signed   By: Odessa Fleming M.D.   On: 05/06/2015 07:14   I have personally reviewed and evaluated these images and lab results as part of my medical decision-making.   EKG Interpretation None      MDM   Final diagnoses:  AKI (acute kidney injury) (HCC)  UTI (lower urinary tract infection)  Sepsis, due to  unspecified organism [facility])    Patient has a DO NOT RESUSCITATE. Specifically on his form he requests IV fluids, antibiotics, treatment, and if necessary admission.  Rectal temperature 100.9. Has a leukocytosis of 16,000. His acute renal failure with creatinine of 8.4 BUN 169. K  5.3.  His x-ray shows no acute process. Urine appears infected. ABG shows 7.30 pH. PCO2 36.4. PO2 73. He is only requiring one to 2 L of nasal cannula O2. With his respiratory rate, I would consider normal PCO2 representation of mild acute respiratory failure with his rate of 40.  After initial evaluation blood and urine cultures obtained. Patient given  IV vancomycin, Zosyn.  Given rectal Tylenol. Given 30 mL/kg IV normal saline. Resp rate improves. Temperature improved. Heart rate improves. Worker breathing improves. Remains nonverbally responsive to myself, and his wife.  Of note, while in the emergency room, on reexam patient to have marked distention of urinary bladder. Foley catheter placed. Drains over 1000 mL of urine.  CRITICAL CARE Performed by: Rolland Porter JOSEPH   Total critical care time: 45  minutes  Critical care time was exclusive of separately billable procedures and treating other patients.  Critical care was necessary to treat or prevent imminent or life-threatening deterioration.  Critical care was time spent personally by me on the following activities: development of treatment plan with patient and/or surrogate as well as nursing, discussions with consultants, evaluation of patient's response to treatment, examination of patient, obtaining history from patient or surrogate, ordering and performing treatments and interventions, ordering and review of laboratory studies, ordering and review of radiographic studies, pulse oximetry and re-evaluation of patient's condition.     Rolland Porter, MD 2015/05/11 0940  Rolland Porter, MD 2015/05/11 1610  Rolland Porter, MD 2015-05-11 [phone]

## 2015-05-07 NOTE — Progress Notes (Addendum)
Received patient from Ed; VS obtained. CHG bath completed. CCM in progress and CCMD notified of admission. IV patent. Foley intact due to urinary retention per  ED with  of cloudy sedimentary urine returning patient responds to painful stimuli.  MRSA swab obtained.

## 2015-05-07 NOTE — ED Notes (Signed)
Notified chaplain to please communicate with pt's family and inform them to come to hospital.

## 2015-05-07 NOTE — ED Notes (Signed)
Notified admitting MD pt has not had output since this morning when this RN did in and out on pt. Pt had out this morning. Pt abd distended again. Obtained order for foley catheter. Placed catheter, pt having output now and abd no longer distended

## 2015-05-07 NOTE — ED Notes (Signed)
Altered mental status, pt arrives from SNF - typically awake and will move eyes in response to movement, now responsive to painful stimuli. Tachypnic. MOST DNR at bedside

## 2015-05-07 NOTE — Progress Notes (Signed)
ANTIBIOTIC CONSULT NOTE - INITIAL  Pharmacy Consult for vancomycin Indication: pneumonia  No Known Allergies  Patient Measurements: Height: 5\' 8"  (172.7 cm) Weight: 114 lb (51.71 kg) IBW/kg (Calculated) : 68.4 Adjusted Body Weight:   Vital Signs: Temp: 100.9 F (38.3 C) (12/06 0709) Temp Source: Rectal (12/06 0709) BP: 118/93 mmHg (12/06 0930) Pulse Rate: 97 (12/06 0930) Intake/Output from previous day:   Intake/Output from this shift: Total I/O In: 1700 [I.V.:1700] Out: 1850 [Urine:1850]  Labs:  Recent Labs  05/25/2015 0812  WBC 16.8*  HGB 13.3  PLT 135*  CREATININE 8.49*   Estimated Creatinine Clearance: 3.9 mL/min (by C-G formula based on Cr of 8.49). No results for input(s): VANCOTROUGH, VANCOPEAK, VANCORANDOM, GENTTROUGH, GENTPEAK, GENTRANDOM, TOBRATROUGH, TOBRAPEAK, TOBRARND, AMIKACINPEAK, AMIKACINTROU, AMIKACIN in the last 72 hours.   Microbiology: Recent Results (from the past 720 hour(s))  Culture, blood (routine x 2)     Status: None (Preliminary result)   Collection Time: 05/11/2015  7:00 AM  Result Value Ref Range Status   Specimen Description BLOOD LEFT FOREARM  Final   Special Requests BOTTLES DRAWN AEROBIC ONLY 5CCS  Final   Culture PENDING  Incomplete   Report Status PENDING  Incomplete    Medical History: No past medical history on file.  Medications:  See EMR  Assessment: 81 yo male presents with AMS. Starting IV antibiotics. Tm 100.9, WBC 16, pt is in ARF, renal fx was normal a couple weeks ago, currently SCr 8.49, eCrCl ~ 5 ml/min. LFTs wnl. UA abnormal. Rec'd vanc and zosyn x1 in ED.  Goal of Therapy:  Vancomycin trough level 15-20 mcg/ml  Plan:  -Vancomycin 1 g IV x1 then 500 mg IV q48h -Dc vanc in 48 hours if blood cx ngtd or mrsa nasal swab is negative -Watch renal function, cultures, duration of therapy -F/u continuation of Zosyn   Agapito GamesAlison Jeslynn Hollander, PharmD, BCPS Clinical Pharmacist Pager: [phone] 05/14/2015 10:01 AM

## 2015-05-07 NOTE — H&P (Signed)
[facility] History and Physical  Jason York ZOX:[id number] DOB: 05/11/1919 DOA: 05/06/2015  Referring physician: Dr Olena Mater PCP: Default, Provider, MD   Chief Complaint: AMS  HPI: Jason York is a 81 y.o. male  Level V caveat: Patient presenting with severe dementia and in worsening altered mental state.   81 year old male presenting in altered mental state from [facility]. Patient was discharged from the hospital on 04/23/2015 after treatment for CHF exacerbation and altered mentation. Patient was discharged on Lasix which she has been getting at the nursing home. Patient initially did well. At baseline patient will recognize some names and speak a few words but is otherwise non-participatory. Patient is bed and chair bound. This morning patient was completely nonverbal and unresponsive and tachypneic. symptoms are constant and getting worse. No other information provided from the nursing home. No other description provided of patient's condition prior to admission. Patient attended to at this time by family friend and wife.    Review of Systems:  Unable to obtain further ROS due to mental status.      Past Medical History  Diagnosis Date  . Heart failure (HCC)   . HTN (hypertension)   . CAD (coronary artery disease)   . HLD (hyperlipidemia)   . Advanced dementia    Past Surgical History  Procedure Laterality Date  . Tonsillectomy    . Appendectomy    . Coronary artery bypass graft     Social History:  reports that he has never smoked. He has never used smokeless tobacco. He reports that he drinks about 1.2 - 1.8 oz of alcohol per week. He reports that he does not use illicit drugs.  No Known Allergies  Family History  Problem Relation Age of Onset  . Stroke Sister      Prior to Admission medications   Medication Sig Start Date End Date Taking? Authorizing Provider  feeding supplement, ENSURE ENLIVE, (ENSURE ENLIVE) LIQD Take 237 mLs  by mouth 2 (two) times daily between meals. 04/24/15  Yes Penny Pia, MD  furosemide (LASIX) 20 MG tablet Take 1 tablet (20 mg total) by mouth daily. As directed by physician Patient taking differently: Take 20 mg by mouth daily.  04/11/15  Yes Sherren Mocha, MD  UNABLE TO FIND Take 120 mLs by mouth 3 (three) times daily. Med Name: Med Pass   Yes Historical Provider, MD   Physical Exam: Filed Vitals:   06/01/2015 0915 05/08/2015 0930 05/12/2015 1000 05/06/2015 1004  BP: 130/80 118/93 123/90 123/90  Pulse: 99 97 95 96  Temp:    97.4 F (36.3 C)  TempSrc:    Axillary  Resp: 32  Height:      Weight:      SpO2: 98% 100% 98% 99%    Wt Readings from Last 3 Encounters:  05/04/2015 51.71 kg (114 lb)  04/11/15 55.792 kg (123 lb)    General: Obtunded, acutely distressed  Eyes:  will not open eyes. Sunken. ENT:  very dry mucous membranes Neck: No thyromegaly, no masses  masses  Cardiovascular: Tachycardic, III/VIsystolic murmur. Trace LE edema.  Respiratory: Tachypneic, increased effort, decreased breath sounds in lung bases. Abdomen:  soft, ntnd Skin:  no rash or induration seen on limited exam Musculoskeletal:  Global decreased tone Psychiatric: Nonparticipatory at this time. Minimal response to painful stimuli.  Neurologic: Unable to fully assess due to mental status.           Labs on Admission:  Basic Metabolic Panel:  Recent Labs Lab 10-04-14 0812  NA 144  K 5.3*  CL 112*  CO2 18*  GLUCOSE 167*  BUN 169*  CREATININE 8.49*  CALCIUM 8.1*   Liver Function Tests:  Recent Labs Lab 10-04-14 0812  AST 16  ALT 13*  ALKPHOS 69  BILITOT 0.6  PROT 6.4*  ALBUMIN 2.7*   No results for input(s): LIPASE, AMYLASE in the last 168 hours. No results for input(s): AMMONIA in the last 168 hours. CBC:  Recent Labs Lab 10-04-14 0812  WBC 16.8*  NEUTROABS 15.1*  HGB 13.3  HCT 39.9  MCV 91.9  PLT 135*   Cardiac Enzymes: No results for input(s): CKTOTAL, CKMB,  CKMBINDEX, TROPONINI in the last 168 hours.  BNP (last 3 results)  Recent Labs  04/22/15 1822  BNP 126.9*    ProBNP (last 3 results)  Recent Labs  04/11/15 1745  PROBNP 1715.00*     CREATININE: 8.49 mg/dL ABNORMAL ([birth year]1903-10-15 [id number]) Estimated creatinine clearance - 3.9 mL/min  CBG:  Recent Labs Lab 10-04-14 0716  GLUCAP 147*    Radiological Exams on Admission: Dg Chest Portable 1 View  05/31/2015  CLINICAL DATA:  81 year old male with shortness of breath and abnormal breath sounds. Initial encounter. EXAM: PORTABLE CHEST 1 VIEW COMPARISON:  04/22/2015 and earlier. FINDINGS: Portable AP semi upright view at 0654 hours. Mildly lower lung volumes. Stable cardiac size and mediastinal contours. Sequelae of CABG. Allowing for portable technique, the lungs are clear. No pneumothorax or pulmonary edema. Mildly increased gas-filled bowel loops in the visualized upper abdomen. IMPRESSION: Mildly lower lung volumes.  The lungs remain clear. Electronically Signed   By: Jason FlemingH  York M.D.   On: 10/16/14 07:14      Assessment/Plan Active Problems:   Dementia   Sepsis (HCC)   Chronic systolic (congestive) heart failure (HCC)   Severe protein-calorie malnutrition (HCC)   Acute renal failure (ARF) (HCC)   Failure to thrive (0-17)   Acute encephalopathy   Sepsis/Urosepsis: UA grossly infected, minimally responsive, Cr 8.49, WBC 16.8, tachycardic, febrile, tachypneic, SBP nml, Lactic acid 1.84, CXR nml. ABG showing pH 7.3, PCO2 36, PO2 73, bicarbonate 17.9 - Stepdown - Paliative Care consult - pt likely approaching end of life - Goals of care. Discussed case with on-call palliative care team who will consult today. Greatly appreciate their assistance. Unsure if pt will make it through current infection - Continue Zos and DC vanc - f/u BCX and UCX - IVF  Acute renal failure: Creatinine 8.49, BUN 169. Baseline creatinine 0.9. Likely secondary to infection and severe dehydration. Patient  started on a diuretic after last admission but has had very little oral intake secondary to his dementia. Difficult to say whether not patient will make a rapid turnaround on fluids. Patient is not a dialysis candidate - IVF - BMP in a.m.  Chronic diastolic just of heart failure: Patient admitted for decompensation couple weeks ago. Currently without evidence of fluid overload but suspect this will change over the next 1-2 days due to IV hydration in the setting of sepsis. Will hold diuretic for 24 hours due to acute renal failure. Last echo showing grade 2 diastolic dysfunction and EF of 60%. - Hold lasix  Failure to thrive, protein calorie malnutrition: Secondary to worsening dementia and current sepsis  - Nutrition consult  - PT/OT   HTN: pt came off all HTN medications a short time ago due to falls and changing mental status - hydralazine prn   Code  Status: DNR  DVT Prophylaxis: Hep Family Communication: Wife and family friend Disposition Plan: Pending Improvement    Titan Karner J, MD Family Medicine [facility] [email or web address] Password [email or web address]

## 2015-05-07 NOTE — ED Notes (Signed)
Pt's urine appears cloudy with  Copious amounts of sediment. This is a change from previous urine output- which was amber colored and no obvious amounts of sediment.

## 2015-05-08 DIAGNOSIS — G9340 Encephalopathy, unspecified: Secondary | ICD-10-CM

## 2015-05-08 DIAGNOSIS — Z66 Do not resuscitate: Secondary | ICD-10-CM | POA: Insufficient documentation

## 2015-05-08 DIAGNOSIS — Z515 Encounter for palliative care: Secondary | ICD-10-CM | POA: Insufficient documentation

## 2015-05-08 DIAGNOSIS — F0390 Unspecified dementia without behavioral disturbance: Secondary | ICD-10-CM

## 2015-05-08 DIAGNOSIS — A419 Sepsis, unspecified organism: Principal | ICD-10-CM

## 2015-05-08 DIAGNOSIS — E43 Unspecified severe protein-calorie malnutrition: Secondary | ICD-10-CM

## 2015-05-08 DIAGNOSIS — N179 Acute kidney failure, unspecified: Secondary | ICD-10-CM

## 2015-05-08 LAB — COMPREHENSIVE METABOLIC PANEL
ALBUMIN: 2.5 g/dL — AB (ref 3.5–5.0)
ALT: 15 U/L — AB (ref 17–63)
AST: 20 U/L (ref 15–41)
Alkaline Phosphatase: 65 U/L (ref 38–126)
Anion gap: 10 (ref 5–15)
BUN: 91 mg/dL — AB (ref 6–20)
CHLORIDE: 128 mmol/L — AB (ref 101–111)
CO2: 21 mmol/L — ABNORMAL LOW (ref 22–32)
CREATININE: 2.82 mg/dL — AB (ref 0.61–1.24)
Calcium: 8.7 mg/dL — ABNORMAL LOW (ref 8.9–10.3)
GFR calc Af Amer: 21 mL/min — ABNORMAL LOW (ref >60)
GFR, EST NON AFRICAN AMERICAN: 18 mL/min — AB (ref >60)
GLUCOSE: 121 mg/dL — AB (ref 65–99)
POTASSIUM: 4.3 mmol/L (ref 3.5–5.1)
Sodium: 159 mmol/L — ABNORMAL HIGH (ref 135–145)
Total Bilirubin: 0.8 mg/dL (ref 0.3–1.2)
Total Protein: 6.6 g/dL (ref 6.5–8.1)

## 2015-05-08 LAB — CBC
HEMATOCRIT: 40.6 % (ref 39.0–52.0)
Hemoglobin: 13.6 g/dL (ref 13.0–17.0)
MCH: 31.2 pg (ref 26.0–34.0)
MCHC: 33.5 g/dL (ref 30.0–36.0)
MCV: 93.1 fL (ref 78.0–100.0)
PLATELETS: 164 10*3/uL (ref 150–400)
RBC: 4.36 MIL/uL (ref 4.22–5.81)
RDW: 14.7 % (ref 11.5–15.5)
WBC: 16.6 10*3/uL — AB (ref 4.0–10.5)

## 2015-05-08 LAB — URINE CULTURE

## 2015-05-08 LAB — GLUCOSE, CAPILLARY
GLUCOSE-CAPILLARY: 84 mg/dL (ref 65–99)
Glucose-Capillary: 77 mg/dL (ref 65–99)
Glucose-Capillary: 81 mg/dL (ref 65–99)
Glucose-Capillary: 92 mg/dL (ref 65–99)

## 2015-05-08 MED ORDER — ENSURE ENLIVE PO LIQD: 237.0000 mL | Freq: Two times a day (BID) | ORAL | Status: DC

## 2015-05-08 MED ORDER — DEXTROSE 5 % IV SOLN
1.0000 g | INTRAVENOUS | Status: DC
  Administered 2015-05-08: 1 g via INTRAVENOUS
  Filled 2015-05-08 (×2): qty 10

## 2015-05-08 MED ORDER — SODIUM CHLORIDE 0.45 % IV SOLN
INTRAVENOUS | Status: DC
  Administered 2015-05-08 – 2015-05-09 (×2): via INTRAVENOUS

## 2015-05-08 NOTE — NC FL2 (Signed)
[place] MEDICAID FL2 LEVEL OF CARE SCREENING TOOL     IDENTIFICATION  Patient Name: Jason York Birthdate: 03/06/1919 Sex: male Admission Date (Current Location): 05/31/2015  [facility] [place]   Facility and Address:  [facility], [address], [place], [place] [address]      Provider Number: [id number]  Attending Physician Name and Address:  Lonia BloodJeffrey T McClung, MD  Relative Name and Phone Number:       Current Level of Care: Hospital Recommended Level of Care: Skilled Nursing Facility Prior Approval Number:    Date Approved/Denied:   PASRR Number: [id number]  Discharge Plan: SNF    Current Diagnoses: Patient Active Problem List   Diagnosis Date Noted  . AKI (acute kidney injury) (HCC)   . DNR (do not resuscitate)   . Palliative care encounter   . Sepsis (HCC) 05/23/2015  . Chronic systolic (congestive) heart failure (HCC) 05/06/2015  . Severe protein-calorie malnutrition (HCC) 05/29/2015  . Acute renal failure (ARF) (HCC) 05/11/2015  . Failure to thrive (0-17) 05/15/2015  . Acute encephalopathy 05/21/2015  . UTI (lower urinary tract infection)   . Malnutrition of moderate degree 04/23/2015  . Dyspnea 04/22/2015  . CHF (congestive heart failure) (HCC) 04/22/2015  . Debility 04/22/2015  . Benign essential HTN 04/22/2015  . Dementia 04/22/2015    Orientation ACTIVITIES/SOCIAL BLADDER RESPIRATION         Indwelling catheter, Incontinent O2 (As needed) (4L [place])  BEHAVIORAL SYMPTOMS/MOOD NEUROLOGICAL BOWEL NUTRITION STATUS      Incontinent Diet (see DC summary)  PHYSICIAN VISITS COMMUNICATION OF NEEDS Height & Weight Skin    Verbally 5\' 7"  (170.2 cm) 114 lbs. Normal          AMBULATORY STATUS RESPIRATION    Assist extensive O2 (As needed) (4L [place])      Personal Care Assistance Level of Assistance  Bathing, Dressing, Feeding Bathing Assistance: Limited assistance Feeding assistance: Limited  assistance Dressing Assistance: Limited assistance      Functional Limitations Info  Sight Sight Info: Impaired           SPECIAL CARE FACTORS FREQUENCY  PT (By licensed PT), OT (By licensed OT)     PT Frequency: 5/wk OT Frequency: 5/wk           Additional Factors Info  Code Status, Allergies, Insulin Sliding Scale Code Status Info: DNR Allergies Info: NKA   Insulin Sliding Scale Info: 4/day       Current Medications (05/08/2015):  This is the current hospital active medication list Current Facility-Administered Medications  Medication Dose Route Frequency Provider Last Rate Last Dose  . 0.9 %  sodium chloride infusion   Intravenous Continuous Ozella Rocksavid J Merrell, MD 100 mL/hr at 05/08/15 0850    . antiseptic oral rinse (BIOTENE) solution 15 mL  15 mL Mouth Rinse PRN Canary BrimMary W Larach, NP      . antiseptic oral rinse (CPC / CETYLPYRIDINIUM CHLORIDE 0.05%) solution 7 mL  7 mL Mouth Rinse q12n4p McAdmits [facility], MD   7 mL at 05/08/15 1600  . chlorhexidine (PERIDEX) 0.12 % solution 15 mL  15 mL Mouth Rinse BID McAdmits [facility], MD   15 mL at 05/08/15 0849  . feeding supplement (ENSURE ENLIVE) (ENSURE ENLIVE) liquid 237 mL  237 mL Oral BID BM Ailene Ardsatherine C Lamberton, RD   237 mL at 05/08/15 1200  . heparin injection 5,000 Units  5,000 Units Subcutaneous 3 times per day Ozella Rocksavid J Merrell, MD  5,000 Units at 05/08/15 1315  . hydrALAZINE (APRESOLINE) injection 5-10 mg  5-10 mg Intravenous Q4H PRN Ozella Rocks, MD      . insulin aspart (novoLOG) injection 0-5 Units  0-5 Units Subcutaneous QHS Ozella Rocks, MD   0 Units at 05/23/2015 2211  . insulin aspart (novoLOG) injection 0-9 Units  0-9 Units Subcutaneous TID WC Ozella Rocks, MD   0 Units at 05/08/2015 1312  . ondansetron (ZOFRAN) tablet 4 mg  4 mg Oral Q6H PRN Ozella Rocks, MD       Or  . ondansetron [facility]) injection 4 mg  4 mg Intravenous Q6H PRN Ozella Rocks, MD      . piperacillin-tazobactam (ZOSYN) IVPB 2.25 g   2.25 g Intravenous 3 times per day Baldemar Friday, RPH 100 mL/hr at 05/08/15 1619 2.25 g at 05/08/15 1619  . sodium chloride 0.9 % injection 3 mL  3 mL Intravenous Q12H Ozella Rocks, MD   3 mL at 05/14/2015 2211     Discharge Medications: Please see discharge summary for a list of discharge medications.  Relevant Imaging Results:  Relevant Lab Results:  Recent Labs    Additional Information SS#: [id number]  Izora Ribas, LCSW

## 2015-05-08 NOTE — Progress Notes (Signed)
OT Cancellation Note  Patient Details Name: Jason York MRN: [id number] DOB: 12/24/1918   Cancelled Treatment:    Reason Eval/Treat Not Completed: Other (comment) Pt from SNF and required total A for ADla nd was bedbound. No acute OT needs. OT signing off. Please notify OT if eval needed for return to SNF. Thanks. [facility] Rankin[facility],HILLARY  Jahziel Sinn, OTR/L  [phone] 05/08/2015 05/08/2015, 11:02 AM

## 2015-05-08 NOTE — Progress Notes (Signed)
Utilization Review Completed.Buell Parcel T1/27/2016  

## 2015-05-08 NOTE — Progress Notes (Signed)
Initial Nutrition Assessment  DOCUMENTATION CODES:   Underweight, Severe malnutrition in context of chronic illness  INTERVENTION:   Ensure Enlive po BID, each supplement provides 350 kcal and 20 grams of protein  NUTRITION DIAGNOSIS:   Increased nutrient needs related to chronic illness as evidenced by estimated needs  GOAL:   Patient will meet greater than or equal to 90% of their needs  MONITOR:   PO intake, Supplement acceptance, Labs, Weight trends, Skin, I & O's  REASON FOR ASSESSMENT:   Consult Assessment of nutrition requirement/status  ASSESSMENT:   81 yo Male with PMH of HTN, dementia; presented for evaluation of decreased level of consciousness.  Patient sleepy upon RD visit.  No family available at bedside.  No % PO intake records available.  Pt is severely malnourished.  Will order oral nutrition supplements to help meet kcal, protein needs.  Nutrition-Focused physical exam completed. Findings are severe fat depletion, severe muscle depletion, and no edema.   Diet Order:  Diet Heart Room service appropriate?: Yes; Fluid consistency:: Thin  Skin:  Reviewed, no issues  Last BM:  N/A  Height:   Ht Readings from Last 1 Encounters:  05/28/2015 5\' 7"  (1.702 m)    Weight:   Wt Readings from Last 1 Encounters:  05/24/2015 114 lb 1.6 oz (51.755 kg)    Ideal Body Weight:  67.2 kg  BMI:  Body mass index is 17.87 kg/(m^2).  Estimated Nutritional Needs:   Kcal:  1200-1400  Protein:  55-65 gm  Fluid:  >/= 1.5 L  EDUCATION NEEDS:   No education needs identified at this time  Maureen ChattersKatie Korry Dalgleish, RD, LDN Pager #: [phone] After-Hours Pager #: [phone]

## 2015-05-08 NOTE — Progress Notes (Signed)
PT Cancellation Note  Patient Details Name: Jason York MRN: [id number] DOB: 11/26/1918   Cancelled Treatment:    Reason Eval/Treat Not Completed: Patient not medically ready. Pt con't to be extremely lethargic. RN deferred early this am and PM. Pt unable to stay aroused to participate in PT at this time. PT to return as able.   Marcene BrawnChadwell, Jason York 05/08/2015, 2:41 PM   Lewis ShockAshly Scottlyn Mchaney, PT, DPT Pager #: [phone] Office #: [phone]

## 2015-05-08 NOTE — Progress Notes (Signed)
[place] TEAM 1 - Stepdown/ICU TEAM PROGRESS NOTE  Jason York:[id number] DOB: January 10, 1919 DOA: May 23, 2015 PCP: Default, Provider, MD  Admit HPI / Brief Narrative: 81 year old male presenting in altered mental state from [facility]. Patient was discharged from the hospital on 04/23/2015 after treatment for CHF exacerbation and altered mentation. Patient was discharged on Lasix which he had been getting at the nursing home. At baseline patient will recognize some names and speak a few words but is otherwise non-participatory. Patient is bed and chair bound. The morning of his admission the patient was completely nonverbal and unresponsive and tachypneic.    HPI/Subjective: The patient remains obtunded.  The patient's wife, son, and daughter at bedside.  We had an extensive discussion concerning his current condition and potential outcomes.  The family is aware that the patient is not likely to recover to his previous level of independent function and that his mental status may never recover.  For now they wish to continue conservative medical treatment which I do not feel is unreasonable as it does not appear to be contributing to the patient's pain or suffering.  I have explained to the family that I suspect the most appropriate treatment plan will be for transition to a hospice home with comfort directed care, but I'm comfortable continuing her current level of care to see if the patient's mental status will improve with ongoing hydration and recovery of renal function.  The family agrees that no CODE BLUE status is consistent with his prior stated wishes and also appeared to make it clear that he would not have desired artificial feeding.  Assessment/Plan:  Sepsis due to urinary tract infection UA grossly infected - continue empiric antibiotic therapy and volume resuscitation  ?Gram + bacteremia Cont empiric abx - f/u speciation and sensitivites   Severe Acute renal  failure Creatinine 8.49, BUN 169 at admission - baseline creatinine 0.9 - not a candidate for HD - rapidly improving with resolution of urinary retention and with volume resuscitation  Urinary Retention  Foley catheter remains  Hypernatremia  Increase free water in IV  Chronic grade 1 diastolic heart failure Patient admitted for decompensation couple weeks ago - currently dehydrated - I have explained that future balancing of volume status versus renal function will be quite difficult  Failure to thrive w/ protein calorie malnutrition Secondary to worsening dementia and current sepsis   HTN pt came off all HTN medications a short time ago due to falls and changing mental status  Code Status: NO CODE BLUE / DNR Family Communication: Spoke at length with wife, daughter, and son Disposition Plan: Continue conservative medical therapy for now and monitor response - if the patient does not improve to the point that he is able to nourish himself anticipate transition to comfort care and possible discharge to a hospice house  Consultants: Palliative Care   Procedures: none  Antibiotics: Zosyn 12/6 > 12/7 Vanc 12/6 >  Rocephin 12/7 >  DVT prophylaxis: SQ heparin   Objective: Blood pressure 146/89, pulse 96, temperature 98.5 F (36.9 C), temperature source Axillary, resp. rate 39, height  (1.702 m), weight 51.755 kg (114 lb 1.6 oz), SpO2 93 %.  Intake/Output Summary (Last 24 hours) at 05/08/15 1531 Last data filed at 05/08/15 1246  Gross per 24 hour  Intake 2578.33 ml  Output   4750 ml  Net -2171.67 ml   Exam: General: Obtunded - rhonchorous respiratory sounds Lungs: Coarse upper airway crackles transmitted throughout with no wheeze  Cardiovascular: Tachycardic without appreciable murmur Abdomen: Nondistended, soft, bowel sounds positive, no rebound, no ascites, no appreciable mass Extremities: No significant cyanosis, clubbing, or edema bilateral lower  extremities  Data Reviewed: Basic Metabolic Panel:  Recent Labs Lab 05/21/2015 0812 05/15/2015 2159 05/08/15 0253  NA 144  --  159*  K 5.3*  --  4.3  CL 112*  --  128*  CO2 18*  --  21*  GLUCOSE 167*  --  121*  BUN 169*  --  91*  CREATININE 8.49* 4.22* 2.82*  CALCIUM 8.1*  --  8.7*  MG  --  2.5*  --   PHOS  --  4.8*  --     CBC:  Recent Labs Lab 05/22/2015 0812 05/15/2015 2159 05/08/15 0253  WBC 16.8* 16.5* 16.6*  NEUTROABS 15.1*  --   --   HGB 13.3 13.0 13.6  HCT 39.9 38.9* 40.6  MCV 91.9 92.0 93.1  PLT 135* 147* 164    Liver Function Tests:  Recent Labs Lab 05/25/2015 0812 05/08/15 0253  AST 16 20  ALT 13* 15*  ALKPHOS 69 65  BILITOT 0.6 0.8  PROT 6.4* 6.6  ALBUMIN 2.7* 2.5*   Coags:  Recent Labs Lab 05/21/2015 2159  INR 1.47    Recent Labs Lab 05/26/2015 2159  APTT 33   CBG:  Recent Labs Lab 05/24/2015 1259 05/20/2015 1834 05/27/2015 2201 05/08/15 0827 05/08/15 1233  GLUCAP 144* 109* 106* 84 77    Recent Results (from the past 240 hour(s))  Culture, blood (routine x 2)     Status: None (Preliminary result)   Collection Time: 05/16/2015  6:50 AM  Result Value Ref Range Status   Specimen Description BLOOD LEFT HAND  Final   Special Requests BOTTLES DRAWN AEROBIC AND ANAEROBIC 5CCS  Final   Culture  Setup Time   Final    GRAM POSITIVE COCCI IN CLUSTERS IN BOTH AEROBIC AND ANAEROBIC BOTTLES CRITICAL RESULT CALLED TO, READ BACK BY AND VERIFIED WITH: Grayce Sessions  05/08/15 MKELLY    Culture PENDING  Incomplete   Report Status PENDING  Incomplete  Culture, blood (routine x 2)     Status: None (Preliminary result)   Collection Time: 05/13/2015  7:00 AM  Result Value Ref Range Status   Specimen Description BLOOD LEFT FOREARM  Final   Special Requests BOTTLES DRAWN AEROBIC ONLY 5CCS  Final   Culture NO GROWTH 1 DAY  Final   Report Status PENDING  Incomplete  Urine culture     Status: None   Collection Time: 05/26/2015  7:48 AM  Result Value Ref Range  Status   Specimen Description URINE, CATHETERIZED  Final   Special Requests NONE  Final   Culture MULTIPLE SPECIES PRESENT, SUGGEST RECOLLECTION  Final   Report Status 05/08/2015 FINAL  Final  MRSA PCR Screening     Status: None   Collection Time: 05/06/2015  7:08 PM  Result Value Ref Range Status   MRSA by PCR NEGATIVE NEGATIVE Final    Comment:        The GeneXpert MRSA Assay (FDA approved for NASAL specimens only), is one component of a comprehensive MRSA colonization surveillance program. It is not intended to diagnose MRSA infection nor to guide or monitor treatment for MRSA infections.      Studies:   Recent x-ray studies have been reviewed in detail by the Attending Physician  Scheduled Meds:  Scheduled Meds: . antiseptic oral rinse  7 mL Mouth Rinse q12n4p  .  chlorhexidine  15 mL Mouth Rinse BID  . feeding supplement (ENSURE ENLIVE)  237 mL Oral BID BM  . heparin  5,000 Units Subcutaneous 3 times per day  . insulin aspart  0-5 Units Subcutaneous QHS  . insulin aspart  0-9 Units Subcutaneous TID WC  . piperacillin-tazobactam (ZOSYN)  IV  2.25 g Intravenous 3 times per day  . sodium chloride  3 mL Intravenous Q12H    Time spent on care of this patient: 35 mins   MCCLUNG,JEFFREY T , MD   [facility] Office  [phone] Pager - Text Page per Loretha StaplerAmion as per below:  On-Call/Text Page:      [email or web address]      password [email or web address]  If 7PM-7AM, please contact night-coverage [email or web address] Password [email or web address] 05/08/2015, 3:31 PM   LOS: 1 day

## 2015-05-08 NOTE — Progress Notes (Signed)
Daily Progress Note   Patient Name: Jason York       Date: 05/08/2015 DOB: 1918-09-23  Age: 81 y.o. MRN#: [id number] Attending Physician: Lonia Blood, MD Primary Care Physician: Default, Provider, MD Admit Date: 05/27/2015  Reason for Consultation/Follow-up: Establishing goals of care  Subjective:  -continued conversation regarding diagnosis, prognosis, treatment options, GOC and EOL wishes, disposition and options  -reviewed Living Will with family, patient has documented for a comfort approach for time for situation like current medical situation  -PMT will continue to support holistically    Length of Stay: 1 day  Current Medications: Scheduled Meds:  . antiseptic oral rinse  7 mL Mouth Rinse q12n4p  . chlorhexidine  15 mL Mouth Rinse BID  . feeding supplement (ENSURE ENLIVE)  237 mL Oral BID BM  . heparin  5,000 Units Subcutaneous 3 times per day  . insulin aspart  0-5 Units Subcutaneous QHS  . insulin aspart  0-9 Units Subcutaneous TID WC  . piperacillin-tazobactam (ZOSYN)  IV  2.25 g Intravenous 3 times per day  . sodium chloride  3 mL Intravenous Q12H    Continuous Infusions: . sodium chloride 100 mL/hr at 05/08/15 0850    PRN Meds: antiseptic oral rinse, hydrALAZINE, ondansetron **OR** ondansetron (ZOFRAN) IV  Physical Exam: Physical Exam  Constitutional: He appears lethargic. He appears cachectic. He appears ill.  HENT:  Mouth/Throat: Mucous membranes are dry. Abnormal dentition. Oropharyngeal exudate present.  Cardiovascular: S1 normal, S2 normal and normal heart sounds.  Tachycardia present.   Pulmonary/Chest: Tachypnea noted. He has decreased breath sounds in the right lower field and the left lower field.  Neurological: He appears lethargic.    Skin: Skin is warm and dry.                Vital Signs: BP 146/89 mmHg  Pulse 96  Temp(Src) 98.5 F (36.9 C) (Axillary)  Resp 39  Ht  (1.702 m)  Wt 51.755 kg (114 lb 1.6 oz)  BMI 17.87 kg/m2  SpO2 93% SpO2: SpO2: 93 % O2 Device: O2 Device: Not Delivered O2 Flow Rate: O2 Flow Rate (L/min): 4 L/min  Intake/output summary:  Intake/Output Summary (Last 24 hours) at 05/08/15 1453 Last data filed at 05/08/15 1246  Gross per 24 hour  Intake 2578.33 ml  Output  6750 ml  Net -4171.67 ml   LBM:   Baseline Weight: Weight: 51.71 kg (114 lb) Most recent weight: Weight: 51.755 kg (114 lb 1.6 oz)       Palliative Assessment/Data: Flowsheet Rows        Most Recent Value   Intake Tab    Referral Department  Hospitalist   Unit at Time of Referral  ER   Palliative Care Primary Diagnosis  Neurology   Date Notified  05/23/2015   Palliative Care Type  New Palliative care   Reason for referral  Clarify Goals of Care, End of Life Care Assistance, Counsel Regarding Hospice   Date of Admission  05/25/2015   Date first seen by Palliative Care  05/02/2015   # of days Palliative referral response time  0 Day(s)   # of days IP prior to Palliative referral  0   Clinical Assessment    Psychosocial & Spiritual Assessment    Palliative Care Outcomes       Additional Data Reviewed: CBC    Component Value Date/Time   WBC 16.6* 05/08/2015 0253   RBC 4.36 05/08/2015 0253   HGB 13.6 05/08/2015 0253   HCT 40.6 05/08/2015 0253   PLT 164 05/08/2015 0253   MCV 93.1 05/08/2015 0253   MCH 31.2 05/08/2015 0253   MCHC 33.5 05/08/2015 0253   RDW 14.7 05/08/2015 0253   LYMPHSABS 0.9 05/18/2015 0812   MONOABS 0.8 05/11/2015 0812   EOSABS 0.0 05/11/2015 0812   BASOSABS 0.0 05/10/2015 0812    CMP     Component Value Date/Time   NA 159* 05/08/2015 0253   K 4.3 05/08/2015 0253   CL 128* 05/08/2015 0253   CO2 21* 05/08/2015 0253   GLUCOSE 121* 05/08/2015 0253   BUN 91* 05/08/2015 0253    CREATININE 2.82* 05/08/2015 0253   CREATININE 0.90 04/11/2015 1745   CALCIUM 8.7* 05/08/2015 0253   PROT 6.6 05/08/2015 0253   ALBUMIN 2.5* 05/08/2015 0253   AST 20 05/08/2015 0253   ALT 15* 05/08/2015 0253   ALKPHOS 65 05/08/2015 0253   BILITOT 0.8 05/08/2015 0253   GFRNONAA 18* 05/08/2015 0253   GFRAA 21* 05/08/2015 0253       Problem List:  Patient Active Problem List   Diagnosis Date Noted  . AKI (acute kidney injury) (HCC)   . DNR (do not resuscitate)   . Palliative care encounter   . Sepsis (HCC) 05/22/2015  . Chronic systolic (congestive) heart failure (HCC) 05/04/2015  . Severe protein-calorie malnutrition (HCC) 05/13/2015  . Acute renal failure (ARF) (HCC) 05/08/2015  . Failure to thrive (0-17) 05/24/2015  . Acute encephalopathy 05/21/2015  . UTI (lower urinary tract infection)   . Malnutrition of moderate degree 04/23/2015  . Dyspnea 04/22/2015  . CHF (congestive heart failure) (HCC) 04/22/2015  . Debility 04/22/2015  . Benign essential HTN 04/22/2015  . Dementia 04/22/2015     Palliative Care Assessment & Plan    1.Code Status:  DNR       Code Status Orders        Start     Ordered   05/30/2015 1051  Do not attempt resuscitation (DNR)   Continuous    Question Answer Comment  In the event of cardiac or respiratory ARREST Do not call a "code blue"   In the event of cardiac or respiratory ARREST Do not perform Intubation, CPR, defibrillation or ACLS   In the event of cardiac or respiratory ARREST Use medication by any  route, position, wound care, and other measures to relive pain and suffering. May use oxygen, suction and manual treatment of airway obstruction as needed for comfort.      2014-10-03 1109    Advance Directive Documentation        Most Recent Value   Type of Advance Directive  Healthcare Power of Attorney   Pre-existing out of facility DNR order (yellow form or pink MOST form)     "MOST" Form in Place?         Goals of  Care/Additional Recommendations:  Treat the treatable, wife continues to hope for improvement   Psycho-social Needs: Education on Hospice  4. Palliative Prophylaxis:   Aspiration, Bowel Regimen, Delirium Protocol, Frequent Pain Assessment and Oral Care  5. Prognosis: Hours - Days  6. Discharge Planning:  Pending   Care plan was discussed with Dr Sharon SellerMcClung, family is requesting a visit to the room from the doctor  Thank you for allowing the Palliative Medicine Team to assist in the care of this patient.   Time In: 1330 Time Out: 1405 Total Time 35 min Prolonged Time Billed  yes         Canary BrimMary W Staley Lunz, NP  05/08/2015, 2:53 PM  Please contact Palliative Medicine Team phone at [phone] for questions and concerns.

## 2015-05-09 ENCOUNTER — Encounter (HOSPITAL_COMMUNITY): Payer: Self-pay

## 2015-05-09 DIAGNOSIS — R0600 Dyspnea, unspecified: Secondary | ICD-10-CM

## 2015-05-09 LAB — COMPREHENSIVE METABOLIC PANEL
ALBUMIN: 2.6 g/dL — AB (ref 3.5–5.0)
ALK PHOS: 62 U/L (ref 38–126)
ALT: 18 U/L (ref 17–63)
AST: 26 U/L (ref 15–41)
BILIRUBIN TOTAL: 1 mg/dL (ref 0.3–1.2)
BUN: 41 mg/dL — AB (ref 6–20)
CALCIUM: 8.8 mg/dL — AB (ref 8.9–10.3)
CO2: 21 mmol/L — ABNORMAL LOW (ref 22–32)
Chloride: >130 mmol/L (ref 101–111)
Creatinine, Ser: 1.42 mg/dL — ABNORMAL HIGH (ref 0.61–1.24)
GFR calc non Af Amer: 41 mL/min — ABNORMAL LOW (ref >60)
GFR, EST AFRICAN AMERICAN: 47 mL/min — AB (ref >60)
GLUCOSE: 96 mg/dL (ref 65–99)
Potassium: 4 mmol/L (ref 3.5–5.1)
SODIUM: 166 mmol/L — AB (ref 135–145)
TOTAL PROTEIN: 6.5 g/dL (ref 6.5–8.1)

## 2015-05-09 LAB — CBC
HCT: 42.7 % (ref 39.0–52.0)
Hemoglobin: 13.8 g/dL (ref 13.0–17.0)
MCH: 30.8 pg (ref 26.0–34.0)
MCHC: 32.3 g/dL (ref 30.0–36.0)
MCV: 95.3 fL (ref 78.0–100.0)
PLATELETS: 246 10*3/uL (ref 150–400)
RBC: 4.48 MIL/uL (ref 4.22–5.81)
RDW: 15.3 % (ref 11.5–15.5)
WBC: 15.7 10*3/uL — AB (ref 4.0–10.5)

## 2015-05-09 LAB — GLUCOSE, CAPILLARY: GLUCOSE-CAPILLARY: 95 mg/dL (ref 65–99)

## 2015-05-09 MED ORDER — MORPHINE SULFATE (PF) 2 MG/ML IV SOLN
1.0000 mg | INTRAVENOUS | Status: DC | PRN
  Administered 2015-05-09: 2 mg via INTRAVENOUS
  Filled 2015-05-09: qty 1

## 2015-05-09 MED ORDER — LORAZEPAM 2 MG/ML IJ SOLN
1.0000 mg | INTRAMUSCULAR | Status: DC | PRN
  Administered 2015-05-09: 1 mg via INTRAVENOUS
  Filled 2015-05-09: qty 1

## 2015-05-09 MED ORDER — MORPHINE SULFATE (PF) 2 MG/ML IV SOLN
1.0000 mg | INTRAVENOUS | Status: DC | PRN
  Administered 2015-05-09 (×2): 2 mg via INTRAVENOUS
  Filled 2015-05-09 (×2): qty 1

## 2015-05-10 LAB — CULTURE, BLOOD (ROUTINE X 2)

## 2015-05-12 LAB — CULTURE, BLOOD (ROUTINE X 2): CULTURE: NO GROWTH

## 2015-06-02 NOTE — Progress Notes (Signed)
[place] TEAM 1 - Stepdown/ICU TEAM Progress Note  Jason York:[id number] DOB: 09-25-1918 DOA: May 22, 2015 PCP: Default, Provider, MD  Admit HPI / Brief Narrative: 82 year old BM   Presenting in altered mental state from [facility]. Patient was discharged from the hospital on 04/23/2015 after treatment for CHF exacerbation and altered mentation. Patient was discharged on Lasix which he had been getting at the nursing home. At baseline patient will recognize some names and speak a few words but is otherwise non-participatory. Patient is bed and chair bound. The morning of his admission the patient was completely nonverbal and unresponsive and tachypneic.   HPI/Subjective: 12/8 upon entering room patient tachypneic unresponsive to painful stimuli  Assessment/Plan: Sepsis due to urinary tract infection UA grossly infected - continue empiric antibiotic therapy and volume resuscitation -now comfort care. No further interventions except those prescribed by palliative care  ?Gram + bacteremia Cont empiric abx - f/u speciation and sensitivites  -now comfort care. No further interventions except those prescribed by palliative care  Severe Acute renal failure Creatinine 8.49, BUN 169 at admission - baseline creatinine 0.9 - not a candidate for HD - rapidly improving with resolution of urinary retention and with volume resuscitation -now comfort care. No further interventions except those prescribed by palliative care  Urinary Retention  Foley catheter remains  Hypernatremia  -now comfort care. No further interventions except those prescribed by palliative care  Chronic grade 1 diastolic heart failure -now comfort care. No further interventions except those prescribed by palliative care  Failure to thrive w/ protein calorie malnutrition -now comfort care. No further interventions except those prescribed by palliative care  HTN -now comfort care. No further  interventions except those prescribed by palliative care  Goals of care -NP Canary Brim from palliative care had just completed meeting with family and walked in the room as I was evaluating patient stated they had come to a consensus, patient is now comfort care.   Code Status: FULL Family Communication: no family present at time of exam Disposition Plan: Comfort care    Consultants: -NP Canary Brim from Palliative care  Procedure/Significant Events:  NA  Culture NA  Antibiotics: NA  DVT prophylaxis: NA   Devices NA   LINES / TUBES:  NA    Continuous Infusions: . sodium chloride 10 mL/hr at 05/03/2015 1504    Objective: VITAL SIGNS: Temp: 99.7 F (37.6 C) (12/08 1243) Temp Source: Axillary (12/08 1940) BP: 63/40 mmHg (12/08 1940) Pulse Rate: 100 (12/08 1243) SPO2; FIO2:   Intake/Output Summary (Last 24 hours) at 05/03/2015 2041 Last data filed at 05/06/2015 1200  Gross per 24 hour  Intake   1333 ml  Output   1250 ml  Net     83 ml     Exam: General: Unresponsive, positive  acute respiratory distress Lungs: poor air movement in all lung fields tachypneic, breathing with accessory muscles Cardiovascular: Tachycardic, Regular rhythm without murmur gallop or rub normal S1 and S2    Data Reviewed: Basic Metabolic Panel:  Recent Labs Lab May 22, 2015 0812 05/22/15 2159 05/08/15 0253 05/14/2015 0359  NA 144  --  159* 166*  K 5.3*  --  4.3 4.0  CL 112*  --  128* >130*  CO2 18*  --  21* 21*  GLUCOSE 167*  --  121* 96  BUN 169*  --  91* 41*  CREATININE 8.49* 4.22* 2.82* 1.42*  CALCIUM 8.1*  --  8.7* 8.8*  MG  --  2.5*  --   --   PHOS  --  4.8*  --   --    Liver Function Tests:  Recent Labs Lab 05/13/2015 0812 05/08/15 0253 May 23, 2015 0359  AST ALT 13* 15* 18  ALKPHOS 69 65 62  BILITOT 0.6 0.8 1.0  PROT 6.4* 6.6 6.5  ALBUMIN 2.7* 2.5* 2.6*   No results for input(s): LIPASE, AMYLASE in the last 168 hours. No results for  input(s): AMMONIA in the last 168 hours. CBC:  Recent Labs Lab 05/20/2015 0812 05/08/2015 2159 05/08/15 0253 05/23/15 0359  WBC 16.8* 16.5* 16.6* 15.7*  NEUTROABS 15.1*  --   --   --   HGB 13.3 13.0 13.6 13.8  HCT 39.9 38.9* 40.6 42.7  MCV 91.9 92.0 93.1 95.3  PLT 135* 147* 164 246   Cardiac Enzymes: No results for input(s): CKTOTAL, CKMB, CKMBINDEX, TROPONINI in the last 168 hours. BNP (last 3 results)  Recent Labs  04/22/15 1822  BNP 126.9*    ProBNP (last 3 results)  Recent Labs  04/11/15 1745  PROBNP 1715.00*    CBG:  Recent Labs Lab 05/08/15 0827 05/08/15 1233 05/08/15 1655 05/08/15 2203 05-23-15 0852  GLUCAP 84 77 81 92 95    Recent Results (from the past 240 hour(s))  Culture, blood (routine x 2)     Status: None (Preliminary result)   Collection Time: 05/18/2015  6:50 AM  Result Value Ref Range Status   Specimen Description BLOOD LEFT HAND  Final   Special Requests BOTTLES DRAWN AEROBIC AND ANAEROBIC 5CCS  Final   Culture  Setup Time   Final    GRAM POSITIVE COCCI IN CLUSTERS IN BOTH AEROBIC AND ANAEROBIC BOTTLES CRITICAL RESULT CALLED TO, READ BACK BY AND VERIFIED WITH: M INGRAM  05/08/15 MKELLY    Culture STAPHYLOCOCCUS SPECIES (COAGULASE NEGATIVE)  Final   Report Status PENDING  Incomplete  Culture, blood (routine x 2)     Status: None (Preliminary result)   Collection Time: 05/13/2015  7:00 AM  Result Value Ref Range Status   Specimen Description BLOOD LEFT FOREARM  Final   Special Requests BOTTLES DRAWN AEROBIC ONLY 5CCS  Final   Culture NO GROWTH 2 DAYS  Final   Report Status PENDING  Incomplete  Urine culture     Status: None   Collection Time: 05/29/2015  7:48 AM  Result Value Ref Range Status   Specimen Description URINE, CATHETERIZED  Final   Special Requests NONE  Final   Culture MULTIPLE SPECIES PRESENT, SUGGEST RECOLLECTION  Final   Report Status 05/08/2015 FINAL  Final  MRSA PCR Screening     Status: None   Collection Time:  05/10/2015  7:08 PM  Result Value Ref Range Status   MRSA by PCR NEGATIVE NEGATIVE Final    Comment:        The GeneXpert MRSA Assay (FDA approved for NASAL specimens only), is one component of a comprehensive MRSA colonization surveillance program. It is not intended to diagnose MRSA infection nor to guide or monitor treatment for MRSA infections.      Studies:  Recent x-ray studies have been reviewed in detail by the Attending Physician  Scheduled Meds:  Scheduled Meds: . antiseptic oral rinse  7 mL Mouth Rinse q12n4p  . chlorhexidine  15 mL Mouth Rinse BID  . sodium chloride  3 mL Intravenous Q12H    Time spent on care of this patient: 40 mins   WOODS, CURTIS J ,  MD  [facility] Office  [phone] Pager - [phone]  On-Call/Text Page:      [email or web address]      password [email or web address]  If 7PM-7AM, please contact night-coverage [email or web address] Password [email or web address] 05/15/2015, 8:41 PM   LOS: 2 days   Care during the described time interval was provided by me .  I have reviewed this patient's available data, including medical history, events of note, physical examination, and all test results as part of my evaluation. I have personally reviewed and interpreted all radiology studies.   Carolyne Littlesurtis Woods, MD [phone] Pager

## 2015-06-02 NOTE — Progress Notes (Signed)
CRITICAL VALUE ALERT  Critical value received:  Na 166 and Cl 130  Date of notification:  05/21/2015  Time of notification:  0530  Nurse who received alert:  S.Conlan Miceli received report from Marcelino DusterMichelle, RN   MD notified (1st page):  Kirtland BouchardK. Schorr  Time of first page:  [phone]  MD notified (2nd page):  Time of second page:  Responding MD:    Time MD responded:

## 2015-06-02 NOTE — Progress Notes (Signed)
Patient continues to be tachypneic and shows signs of air hunger. [facility] notified and orders received for morphine 1 - 2 mg IV. Will continue to monitor and notify MD as needed.

## 2015-06-02 NOTE — Progress Notes (Signed)
Daily Progress Note   Patient Name: Jason York       Date: 2015-06-07 DOB: [birth year]  Age: 82 y.o. MRN#: [id number] Attending Physician: Drema Dallas, MD Primary Care Physician: Default, Provider, MD Admit Date: 05/31/2015  Reason for Consultation/Follow-up: Establishing goals of care  Subjective:  -continued conversation regarding diagnosis, prognosis, treatment options, GOC and EOL wishes, disposition and options  -family has made decision for shift to full comfort care, no further life prolonging interventions.  Education on natural trajectory and expectation at EOL    -PMT will continue to support holistically    Length of Stay: 2 days  Current Medications: Scheduled Meds:  . antiseptic oral rinse  7 mL Mouth Rinse q12n4p  . chlorhexidine  15 mL Mouth Rinse BID  . sodium chloride  3 mL Intravenous Q12H    Continuous Infusions: . sodium chloride 80 mL/hr at Jun 07, 2015 1017    PRN Meds: antiseptic oral rinse, LORazepam, morphine injection, ondansetron **OR** ondansetron (ZOFRAN) IV  Physical Exam: Physical Exam  Constitutional: He appears cachectic. He appears ill.  HENT:  Mouth/Throat: Mucous membranes are dry. Abnormal dentition. No oropharyngeal exudate.  -dry membranes  Cardiovascular: S1 normal, S2 normal and normal heart sounds.  Tachycardia present.   Pulmonary/Chest: Tachypnea noted. He has decreased breath sounds in the right lower field and the left lower field.  Neurological: He is unresponsive.  Skin: Skin is warm and dry.                Vital Signs: BP 135/98 mmHg  Pulse 100  Temp(Src) 99.7 F (37.6 C) (Axillary)  Resp 43  Ht  (1.702 m)  Wt 51.755 kg (114 lb 1.6 oz)  BMI 17.87 kg/m2  SpO2 95% SpO2: SpO2: 95 % O2 Device: O2 Device:  Nasal Cannula O2 Flow Rate: O2 Flow Rate (L/min): 2 L/min  Intake/output summary:   Intake/Output Summary (Last 24 hours) at June 07, 2015 1452 Last data filed at 06-07-2015 1200  Gross per 24 hour  Intake   1461 ml  Output   1250 ml  Net    211 ml   LBM: Last BM Date:  (PTA) Baseline Weight: Weight: 51.71 kg (114 lb) Most recent weight: Weight: 51.755 kg (114 lb 1.6 oz)       Palliative Assessment/Data: Flowsheet Rows  Most Recent Value   Intake Tab    Referral Department  Hospitalist   Unit at Time of Referral  ER   Palliative Care Primary Diagnosis  Neurology   Date Notified  05-19-15   Palliative Care Type  New Palliative care   Reason for referral  Clarify Goals of Care, End of Life Care Assistance, Counsel Regarding Hospice   Date of Admission  05-19-2015   Date first seen by Palliative Care  05-19-15   # of days Palliative referral response time  0 Day(s)   # of days IP prior to Palliative referral  0   Clinical Assessment    Psychosocial & Spiritual Assessment    Palliative Care Outcomes       Additional Data Reviewed: CBC    Component Value Date/Time   WBC 15.7* 05/04/2015 0359   RBC 4.48 05/16/2015 0359   HGB 13.8 05/08/2015 0359   HCT 42.7 05/06/2015 0359   PLT 246 05/06/2015 0359   MCV 95.3 05/12/2015 0359   MCH 30.8 05/31/2015 0359   MCHC 32.3 05/11/2015 0359   RDW 15.3 05/04/2015 0359   LYMPHSABS 0.9 05-19-2015 0812   MONOABS 0.8 05-19-2015 0812   EOSABS 0.0 05-19-2015 0812   BASOSABS 0.0 05-19-2015 0812    CMP     Component Value Date/Time   NA 166* 05/31/2015 0359   K 4.0 05/08/2015 0359   CL >130* 05/26/2015 0359   CO2 21* 05/17/2015 0359   GLUCOSE 96 05/02/2015 0359   BUN 41* 05/29/2015 0359   CREATININE 1.42* 05/19/2015 0359   CREATININE 0.90 04/11/2015 1745   CALCIUM 8.8* 05/12/2015 0359   PROT 6.5 05/23/2015 0359   ALBUMIN 2.6* 05/22/2015 0359   AST 26 05/25/2015 0359   ALT 18 05/26/2015 0359   ALKPHOS 62 05/24/2015 0359    BILITOT 1.0 05/21/2015 0359   GFRNONAA 41* 05/08/2015 0359   GFRAA 47* 05/03/2015 0359       Problem List:  Patient Active Problem List   Diagnosis Date Noted  . AKI (acute kidney injury) (HCC)   . DNR (do not resuscitate)   . Palliative care encounter   . Sepsis (HCC) 05/19/2015  . Chronic systolic (congestive) heart failure (HCC) 05/19/15  . Severe protein-calorie malnutrition (HCC) 19-May-2015  . Acute renal failure (ARF) (HCC) 2015/05/19  . Failure to thrive (0-17) 2015/05/19  . Acute encephalopathy 05-19-15  . UTI (lower urinary tract infection)   . Malnutrition of moderate degree 04/23/2015  . Dyspnea 04/22/2015  . CHF (congestive heart failure) (HCC) 04/22/2015  . Debility 04/22/2015  . Benign essential HTN 04/22/2015  . Dementia 04/22/2015     Palliative Care Assessment & Plan    1.Code Status:  DNR       Code Status Orders        Start     Ordered   2015/05/19 1051  Do not attempt resuscitation (DNR)   Continuous    Question Answer Comment  In the event of cardiac or respiratory ARREST Do not call a "code blue"   In the event of cardiac or respiratory ARREST Do not perform Intubation, CPR, defibrillation or ACLS   In the event of cardiac or respiratory ARREST Use medication by any route, position, wound care, and other measures to relive pain and suffering. May use oxygen, suction and manual treatment of airway obstruction as needed for comfort.      May 19, 2015 1109    Advance Directive Documentation  Most Recent Value   Type of Advance Directive  Healthcare Power of Attorney   Pre-existing out of facility DNR order (yellow form or pink MOST form)     "MOST" Form in Place?         Goals of Care/Additional Recommendations:  Focus of care is comfort  Shift to full comfort, no further life prolonging measures   Psycho-social Needs: Education on Hospice  4. Palliative Prophylaxis:    Aspiration, Bowel Regimen, Delirium Protocol,  Frequent Pain Assessment and Oral Care  5. Prognosis: Hours - Days  6. Discharge Planning:  Home with hospice, have written  for choice    Care plan was discussed with Dr Joseph ArtWoods  Thank you for allowing the Palliative Medicine Team to assist in the care of this patient.   Time In: 1330 Time Out: 1405 Total Time 35 min Prolonged Time Billed  yes         Canary BrimMary W Kendricks Reap, NP  05/26/2015, 2:52 PM  Please contact Palliative Medicine Team phone at [phone] for questions and concerns.

## 2015-06-02 NOTE — Progress Notes (Signed)
   05/13/2015 1500  Clinical Encounter Type  Visited With Health care provider  Visit Type Initial  Referral From Care management  Consult/Referral To Chaplain  Spiritual Encounters  Spiritual Needs Other (Comment)   Chaplain visited however, Pt. Was unresponsive and family was not by bedside. Chaplain will drop by later to try to talk with family.

## 2015-06-02 DEATH — deceased
